# Patient Record
Sex: Female | Born: 1955 | Race: White | Hispanic: No | Smoking: Former smoker
Health system: Southern US, Community
[De-identification: ages and names within clinical notes are randomized; demographics above are authoritative.]

## PROBLEM LIST (undated history)

## (undated) DIAGNOSIS — E559 Vitamin D deficiency, unspecified: Secondary | ICD-10-CM

## (undated) DIAGNOSIS — K760 Fatty (change of) liver, not elsewhere classified: Secondary | ICD-10-CM

## (undated) DIAGNOSIS — I10 Essential (primary) hypertension: Secondary | ICD-10-CM

## (undated) DIAGNOSIS — N959 Unspecified menopausal and perimenopausal disorder: Secondary | ICD-10-CM

## (undated) DIAGNOSIS — M797 Fibromyalgia: Secondary | ICD-10-CM

## (undated) DIAGNOSIS — J309 Allergic rhinitis, unspecified: Secondary | ICD-10-CM

## (undated) DIAGNOSIS — E119 Type 2 diabetes mellitus without complications: Secondary | ICD-10-CM

## (undated) DIAGNOSIS — K589 Irritable bowel syndrome without diarrhea: Secondary | ICD-10-CM

## (undated) DIAGNOSIS — K30 Functional dyspepsia: Secondary | ICD-10-CM

## (undated) DIAGNOSIS — M545 Low back pain, unspecified: Secondary | ICD-10-CM

## (undated) DIAGNOSIS — M109 Gout, unspecified: Secondary | ICD-10-CM

## (undated) DIAGNOSIS — G894 Chronic pain syndrome: Secondary | ICD-10-CM

## (undated) DIAGNOSIS — R5382 Chronic fatigue, unspecified: Secondary | ICD-10-CM

## (undated) DIAGNOSIS — M1990 Unspecified osteoarthritis, unspecified site: Secondary | ICD-10-CM

## (undated) DIAGNOSIS — E039 Hypothyroidism, unspecified: Secondary | ICD-10-CM

## (undated) DIAGNOSIS — E782 Mixed hyperlipidemia: Secondary | ICD-10-CM

## (undated) DIAGNOSIS — G4700 Insomnia, unspecified: Secondary | ICD-10-CM

## (undated) DIAGNOSIS — R252 Cramp and spasm: Secondary | ICD-10-CM

## (undated) DIAGNOSIS — R112 Nausea with vomiting, unspecified: Secondary | ICD-10-CM

## (undated) DIAGNOSIS — D518 Other vitamin B12 deficiency anemias: Secondary | ICD-10-CM

## (undated) DIAGNOSIS — R000 Tachycardia, unspecified: Secondary | ICD-10-CM

## (undated) DIAGNOSIS — E041 Nontoxic single thyroid nodule: Secondary | ICD-10-CM

## (undated) DIAGNOSIS — J189 Pneumonia, unspecified organism: Secondary | ICD-10-CM

## (undated) DIAGNOSIS — F419 Anxiety disorder, unspecified: Secondary | ICD-10-CM

## (undated) HISTORY — DX: Unspecified menopausal and perimenopausal disorder: N95.9

## (undated) HISTORY — DX: Cramp and spasm: R25.2

## (undated) HISTORY — DX: Chronic pain syndrome: G89.4

## (undated) HISTORY — DX: Hypothyroidism, unspecified: E03.9

## (undated) HISTORY — DX: Vitamin D deficiency, unspecified: E55.9

## (undated) HISTORY — DX: Essential (primary) hypertension: I10

## (undated) HISTORY — DX: Allergic rhinitis, unspecified: J30.9

## (undated) HISTORY — DX: Mixed hyperlipidemia: E78.2

## (undated) HISTORY — DX: Other vitamin B12 deficiency anemias: D51.8

## (undated) HISTORY — PX: CHOLECYSTECTOMY: SHX55

## (undated) HISTORY — DX: Anxiety disorder, unspecified: F41.9

## (undated) HISTORY — DX: Nontoxic single thyroid nodule: E04.1

## (undated) HISTORY — DX: Fatty (change of) liver, not elsewhere classified: K76.0

## (undated) HISTORY — PX: BACK SURGERY: SHX140

## (undated) HISTORY — DX: Insomnia, unspecified: G47.00

## (undated) HISTORY — PX: TOTAL ABDOMINAL HYSTERECTOMY: SHX209

## (undated) HISTORY — DX: Irritable bowel syndrome, unspecified: K58.9

## (undated) HISTORY — DX: Type 2 diabetes mellitus without complications: E11.9

## (undated) HISTORY — PX: ANKLE SURGERY: SHX546

## (undated) HISTORY — DX: Low back pain, unspecified: M54.50

## (undated) HISTORY — DX: Chronic fatigue, unspecified: R53.82

## (undated) HISTORY — DX: Gout, unspecified: M10.9

---
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
## 1997-12-23 HISTORY — PX: BLADDER SURGERY: SHX569

## 2000-12-01 ENCOUNTER — Other Ambulatory Visit: Admission: RE | Admit: 2000-12-01 | Discharge: 2000-12-01 | Payer: Self-pay

## 2009-12-23 HISTORY — PX: SHOULDER SURGERY: SHX246

## 2012-02-20 ENCOUNTER — Telehealth (INDEPENDENT_AMBULATORY_CARE_PROVIDER_SITE_OTHER): Payer: Self-pay | Admitting: General Surgery

## 2012-02-20 NOTE — Telephone Encounter (Signed)
SEE PREVIOUS  TELEPHONE ENCOUNTER/GY

## 2012-05-23 HISTORY — PX: COLONOSCOPY WITH ESOPHAGOGASTRODUODENOSCOPY (EGD): SHX5779

## 2018-07-16 ENCOUNTER — Other Ambulatory Visit: Payer: Self-pay | Admitting: Neurosurgery

## 2018-07-16 DIAGNOSIS — M5442 Lumbago with sciatica, left side: Principal | ICD-10-CM

## 2018-07-16 DIAGNOSIS — G8929 Other chronic pain: Secondary | ICD-10-CM

## 2018-07-28 ENCOUNTER — Ambulatory Visit
Admission: RE | Admit: 2018-07-28 | Discharge: 2018-07-28 | Disposition: A | Discharge status: Home / Self Care | Payer: Medicare PPO | Source: Ambulatory Visit | Attending: Neurosurgery | Admitting: Neurosurgery

## 2018-07-28 DIAGNOSIS — M5442 Lumbago with sciatica, left side: Principal | ICD-10-CM

## 2018-07-28 DIAGNOSIS — G8929 Other chronic pain: Secondary | ICD-10-CM

## 2018-07-28 MED ORDER — METHYLPREDNISOLONE ACETATE 40 MG/ML INJ SUSP (RADIOLOG
120.0000 mg | Freq: Once | INTRAMUSCULAR | Status: AC
  Administered 2018-07-28: 120 mg via EPIDURAL

## 2018-07-28 MED ORDER — IOPAMIDOL (ISOVUE-M 200) INJECTION 41%
1.0000 mL | Freq: Once | INTRAMUSCULAR | Status: AC
  Administered 2018-07-28: 1 mL via EPIDURAL

## 2018-07-28 NOTE — Discharge Instructions (Signed)

## 2018-08-25 ENCOUNTER — Other Ambulatory Visit: Payer: Self-pay | Admitting: Neurosurgery

## 2018-08-25 DIAGNOSIS — G8929 Other chronic pain: Secondary | ICD-10-CM

## 2018-08-25 DIAGNOSIS — M5442 Lumbago with sciatica, left side: Principal | ICD-10-CM

## 2018-09-07 ENCOUNTER — Other Ambulatory Visit: Payer: Medicare PPO

## 2018-09-09 ENCOUNTER — Other Ambulatory Visit: Payer: Medicare PPO

## 2018-09-22 HISTORY — PX: LUMBAR FUSION: SHX111

## 2019-12-07 ENCOUNTER — Encounter: Payer: Self-pay | Admitting: Gastroenterology

## 2020-01-03 ENCOUNTER — Other Ambulatory Visit: Payer: Self-pay

## 2020-01-03 ENCOUNTER — Encounter: Payer: Self-pay | Admitting: Gastroenterology

## 2020-01-03 ENCOUNTER — Telehealth (INDEPENDENT_AMBULATORY_CARE_PROVIDER_SITE_OTHER): Payer: Medicare Other | Admitting: Gastroenterology

## 2020-01-03 VITALS — Ht 66.0 in | Wt 219.0 lb

## 2020-01-03 DIAGNOSIS — R112 Nausea with vomiting, unspecified: Secondary | ICD-10-CM | POA: Diagnosis not present

## 2020-01-03 DIAGNOSIS — Z01818 Encounter for other preprocedural examination: Secondary | ICD-10-CM

## 2020-01-03 DIAGNOSIS — G8929 Other chronic pain: Secondary | ICD-10-CM

## 2020-01-03 DIAGNOSIS — K760 Fatty (change of) liver, not elsewhere classified: Secondary | ICD-10-CM

## 2020-01-03 DIAGNOSIS — K219 Gastro-esophageal reflux disease without esophagitis: Secondary | ICD-10-CM

## 2020-01-03 DIAGNOSIS — R1011 Right upper quadrant pain: Secondary | ICD-10-CM

## 2020-01-03 DIAGNOSIS — R1013 Epigastric pain: Secondary | ICD-10-CM

## 2020-01-03 DIAGNOSIS — R1310 Dysphagia, unspecified: Secondary | ICD-10-CM

## 2020-01-03 DIAGNOSIS — D509 Iron deficiency anemia, unspecified: Secondary | ICD-10-CM

## 2020-01-03 MED ORDER — ESOMEPRAZOLE MAGNESIUM 40 MG PO CPDR: 40.0000 mg | DELAYED_RELEASE_CAPSULE | Freq: Every day | ORAL | 6 refills | Status: DC

## 2020-01-03 NOTE — Progress Notes (Signed)
Chief Complaint: Multiple GI problems  Referring Provider:  Zoila Shutter, NP      ASSESSMENT AND PLAN;   #1.  Chronic RUQ/epi pain with N/V. Neg extensive work-up @[facility]  including EGD, colon 05/2012, MRCP 2018, Meckel's scan 2018, [place], [place].  #2.  GERD with intermittent eso dysphagia.  #3.  IBS with alternating diarrhea and constipation. Neg colon with random Bx 05/2012. next routine colon due in 44yr.  #4.  Fatty liver with abn alk phos. Neg MRCP 2018. S/p cholecystectomy.  #5. H/O IDA  #6. Comorbid conditions include anxiety/depression, chronic pain syndrome on narcotics, HTN, HLD, DM2, hypothyroidism, gout, chronic fatigue syndrome, fibromyalgia.   Plan: -Proceed with EGD with possible dil, if needed. -Nexium 40 mg p.o. QD, 30, 6 refills -If neg, consider solid-phase GES followed by repeat H2-breath test to r/o SIBO. -Minimize pain medications. -Check CBC, CMP, lipase, celiac screen and TSH at time of EGD. -Encouraged gradual weight loss. -Please get prev labs results from Dr. HTenna Delaineoffice and CT report/S[facility]O breath test [place] [facility]  Not in care everywhere.     HPI:    Christy Stonekingis a 64y.o. female  Stomach problems "all my life". Dx with IBS with alternating diarrhea several years ago.  Has been having epigastric/right upper quadrant abdominal pain x 7 to 8 years, getting worse lately.  She describes pain as sharp, occasionally radiating to the back, associated with nausea and intermittent vomiting.  She does have longstanding history of heartburn.  Occasional dysphagia-mostly to solids, mostly in the mid chest.  Has regurgitation as well.  Has alternating diarrhea and constipation with abdominal bloating.  When she is having diarrhea, she would have 4-5 bowel movements per day, mushy, without nocturnal symptoms, with mucus but no blood.  When she gets constipated she would have pellet-like stools.  Just before her menstrual cycles or when she is  under stress, diarrhea gets worse.  Had extensive work-up in the past as described below.  The GI symptoms get worse with stress and anxiety.   Does admit that she has not been sleeping well.    Previous GI work-up @[facility] : MRCP 09/2017: Hepatomegaly, s/p cholecystectomy.  Mildly prominent CBD 8 mm.  No choledocholithiasis.  Normal PD. EGD with small bowel biopsies 06/04/2012: neg except for erosive esophagitis LA gd A, neg SB bx, neg CLO. Colonoscopy 06/04/2012: Normal except for small hyperplastic polyps, neg random colonic bx. Hydrogen breath test for SIBO-results awaited. Meckel scan 09/2017: neg [place]: CT 2016: neg per notes, report awaited. Past Medical History:  Diagnosis Date  . Allergic rhinitis   . Anxiety disorder   . Chronic fatigue   . Chronic pain disorder   . Cramp and spasm   . Essential (primary) hypertension   . Gout   . Hyperlipemia, mixed   . Hypothyroidism   . IBS (irritable bowel syndrome)   . Insomnia   . Low back pain   . Nontoxic single thyroid nodule   . Other vitamin B12 deficiency anemias   . Type 2 diabetes mellitus ([place]   . Unspecified menopausal and perimenopausal disorder   . Vitamin D deficiency     Past Surgical History:  Procedure Laterality Date  . ANKLE SURGERY     x5  . BLADDER SURGERY  1999   bladder tack  . CESAREAN SECTION     x3  . COLONOSCOPY WITH ESOPHAGOGASTRODUODENOSCOPY (EGD)  05/2012  . LUMBAR FUSION  09/2018  . SHOULDER SURGERY  2011  . TOTAL ABDOMINAL HYSTERECTOMY      Family History  Problem Relation Age of Onset  . Diabetes Mother   . Colon cancer Neg Hx     Social History   Tobacco Use  . Smoking status: Never Smoker  . Smokeless tobacco: Never Used  Substance Use Topics  . Alcohol use: Never  . Drug use: Not on file    Current Outpatient Medications  Medication Sig Dispense Refill  . allopurinol (ZYLOPRIM) 300 MG tablet Take 300 mg by mouth daily.    Marland Kitchen ALPRAZolam (XANAX) 0.5 MG tablet  Take 0.5 mg by mouth as needed for anxiety.    . busPIRone (BUSPAR) 10 MG tablet Take 10 mg by mouth 2 (two) times daily.    . cloNIDine (CATAPRES) 0.3 MG tablet Take 0.3 mg by mouth 2 (two) times daily.    Marland Kitchen labetalol (NORMODYNE) 100 MG tablet Take 100 mg by mouth 2 (two) times daily.    Marland Kitchen levocetirizine (XYZAL) 5 MG tablet Take 5 mg by mouth every evening.    Marland Kitchen losartan (COZAAR) 25 MG tablet Take 25 mg by mouth daily.    . ondansetron (ZOFRAN) 4 MG tablet Take 4 mg by mouth every 8 (eight) hours as needed for nausea or vomiting.    Marland Kitchen oxyCODONE-acetaminophen (PERCOCET) 10-325 MG tablet Take 1 tablet by mouth every 6 (six) hours as needed for pain.    Marland Kitchen tiZANidine (ZANAFLEX) 4 MG tablet Take 4 mg by mouth every 6 (six) hours as needed for muscle spasms.    . traZODone (DESYREL) 100 MG tablet Take 100 mg by mouth at bedtime.     No current facility-administered medications for this visit.    Allergies  Allergen Reactions  . Prednisone   . Sulfa Antibiotics     Review of Systems:  Constitutional: Denies fever, chills, diaphoresis, appetite change and has fatigue.  HEENT: Denies photophobia, eye pain, redness, hearing loss, ear pain, congestion, sore throat, rhinorrhea, sneezing, mouth sores, neck pain, neck stiffness and tinnitus.   Respiratory: Denies SOB, DOE, cough, chest tightness,  and wheezing.   Cardiovascular: Denies chest pain, palpitations and leg swelling.  Genitourinary: Denies dysuria, urgency, frequency, hematuria, flank pain and difficulty urinating.  Musculoskeletal: Has myalgias, back pain, joint swelling, arthralgias and gait problem.  Skin: No rash.  Neurological: Denies dizziness, seizures, syncope, weakness, light-headedness, numbness and headaches.  Hematological: Denies adenopathy. Easy bruising, personal or family bleeding history  Psychiatric/Behavioral: Has anxiety or depression     Physical Exam:    Ht 5' 6"  (1.676 m)   Wt 219 lb (99.3 kg)   BMI 35.35  kg/m  Wt Readings from Last 3 Encounters:  01/03/20 219 lb (99.3 kg)  Not examined since it was a televisit  I connected with  Jenean Lindau on 01/03/20 by a video enabled telemedicine application and verified that I am speaking with the correct person using two identifiers.   I discussed the limitations of evaluation and management by telemedicine. The patient expressed understanding and agreed to proceed.     Carmell Austria, MD 01/03/2020, 8:56 AM  Cc: Zoila Shutter, NP

## 2020-01-03 NOTE — Patient Instructions (Signed)
If you are age 62 or older, your body mass index should be between 23-30. Your Body mass index is 35.35 kg/m. If this is out of the aforementioned range listed, please consider follow up with your Primary Care Provider.  If you are age 60 or younger, your body mass index should be between 19-25. Your Body mass index is 35.35 kg/m. If this is out of the aformentioned range listed, please consider follow up with your Primary Care Provider.   We have sent the following medications to your pharmacy for you to pick up at your convenience: Nexium  Encourage to continue to have gradual weight loss.   You have been scheduled for an endoscopy. Please follow written instructions given to you at your visit today. If you use inhalers (even only as needed), please bring them with you on the day of your procedure. Your physician has requested that you go to www.startemmi.com and enter the access code given to you at your visit today. This web site gives a general overview about your procedure. However, you should still follow specific instructions given to you by our office regarding your preparation for the procedure.  Due to recent COVID-19 restrictions implemented by [employer] and state authorities and in an effort to keep both patients and staff as safe as possible, [place] requires COVID-19 testing prior to any scheduled endoscopic procedure. The testing center is located at [address] [place], [place] [address] in the [facility] [employer]  suite.  Your appointment has been scheduled for 01/18/20 at 9:20am.   Please bring your insurance cards to this appointment. You will require your COVID screen 2 business days prior to your endoscopic procedure.  You are not required to quarantine after your screening.  You will only receive a phone call with the results if it is POSITIVE.  If you do not receive a call the day before your procedure you  should begin your prep, if ordered, and you should report to the endo center for your procedure at your designated appointment arrival time ( one hour prior to the procedure time). There is no cost to you for the screening on the day of the swab.  [facility] will file with your insurance company for the testing.    You may receive an automated phone call prior to your procedure or have a message in your MyChart that you have an appointment for a BP/15 at the [facility], please disregard this message.  Your testing will be at the [place] [place] location.   If you are leaving [facility] travel [place] on [address], turn left onto [address], turn night onto [address] at the 1st stop light turn right, pass the [employer] on your right and proceed to [place] (white building).  Please go to the lab at [facility] ([place] You will need to go to level "B", you do not need an appointment for this. Hours available are 7:30 am - 4:30 pm.   Thank you,  Dr. Jackquline Denmark

## 2020-01-06 ENCOUNTER — Encounter: Payer: Self-pay | Admitting: Gastroenterology

## 2020-01-10 ENCOUNTER — Encounter: Payer: Medicare Other | Admitting: Gastroenterology

## 2020-01-18 ENCOUNTER — Ambulatory Visit (INDEPENDENT_AMBULATORY_CARE_PROVIDER_SITE_OTHER): Payer: Medicare Other

## 2020-01-18 ENCOUNTER — Other Ambulatory Visit: Payer: Self-pay | Admitting: Gastroenterology

## 2020-01-18 DIAGNOSIS — Z1159 Encounter for screening for other viral diseases: Secondary | ICD-10-CM

## 2020-01-18 LAB — SARS CORONAVIRUS 2 (TAT 6-24 HRS): SARS Coronavirus 2: NEGATIVE

## 2020-01-20 ENCOUNTER — Other Ambulatory Visit: Payer: Self-pay

## 2020-01-20 ENCOUNTER — Encounter: Payer: Self-pay | Admitting: Gastroenterology

## 2020-01-20 ENCOUNTER — Ambulatory Visit (AMBULATORY_SURGERY_CENTER): Payer: Medicare Other | Admitting: Gastroenterology

## 2020-01-20 VITALS — BP 138/91 | HR 85 | Temp 97.3°F | Resp 15 | Ht 66.0 in | Wt 219.0 lb

## 2020-01-20 DIAGNOSIS — K2970 Gastritis, unspecified, without bleeding: Secondary | ICD-10-CM | POA: Diagnosis not present

## 2020-01-20 DIAGNOSIS — R1310 Dysphagia, unspecified: Secondary | ICD-10-CM | POA: Diagnosis not present

## 2020-01-20 DIAGNOSIS — R1011 Right upper quadrant pain: Secondary | ICD-10-CM

## 2020-01-20 DIAGNOSIS — K3189 Other diseases of stomach and duodenum: Secondary | ICD-10-CM | POA: Diagnosis not present

## 2020-01-20 DIAGNOSIS — K219 Gastro-esophageal reflux disease without esophagitis: Secondary | ICD-10-CM | POA: Diagnosis not present

## 2020-01-20 DIAGNOSIS — K2950 Unspecified chronic gastritis without bleeding: Secondary | ICD-10-CM | POA: Diagnosis not present

## 2020-01-20 MED ORDER — SODIUM CHLORIDE 0.9 % IV SOLN: 500.0000 mL | Freq: Once | INTRAVENOUS | Status: DC

## 2020-01-20 NOTE — Progress Notes (Signed)
Called to room to assist during endoscopic procedure.  Patient ID and intended procedure confirmed with present staff. Received instructions for my participation in the procedure from the performing physician.  

## 2020-01-20 NOTE — Patient Instructions (Signed)
Thank you for letting us take care of your healthcare needs today.  Please refer to education sheets provided for additional information.    YOU HAD AN ENDOSCOPIC PROCEDURE TODAY AT [facility]:   Refer to the procedure report that was given to you for any specific questions about what was found during the examination.  If the procedure report does not answer your questions, please call your gastroenterologist to clarify.  If you requested that your care partner not be given the details of your procedure findings, then the procedure report has been included in a sealed envelope for you to review at your convenience later.  YOU SHOULD EXPECT: Some feelings of bloating in the abdomen. Passage of more gas than usual.  Walking can help get rid of the air that was put into your GI tract during the procedure and reduce the bloating. If you had a lower endoscopy (such as a colonoscopy or flexible sigmoidoscopy) you may notice spotting of blood in your stool or on the toilet paper. If you underwent a bowel prep for your procedure, you may not have a normal bowel movement for a few days.  Please Note:  You might notice some irritation and congestion in your nose or some drainage.  This is from the oxygen used during your procedure.  There is no need for concern and it should clear up in a day or so.  SYMPTOMS TO REPORT IMMEDIATELY:   Following upper endoscopy (EGD)  Vomiting of blood or coffee ground material  New chest pain or pain under the shoulder blades  Painful or persistently difficult swallowing  New shortness of breath  Fever of 100F or higher  Black, tarry-looking stools  For urgent or emergent issues, a gastroenterologist can be reached at any hour by calling [phone].  DIET:  Please follow Post Dilation Diet sheet.  ACTIVITY:  You should plan to take it easy for the rest of today and you should NOT DRIVE or use heavy machinery until tomorrow (because of the sedation  medicines used during the test).    FOLLOW UP: Our staff will call the number listed on your records 48-72 hours following your procedure to check on you and address any questions or concerns that you may have regarding the information given to you following your procedure. If we do not reach you, we will leave a message.  We will attempt to reach you two times.  During this call, we will ask if you have developed any symptoms of COVID 19. If you develop any symptoms (ie: fever, flu-like symptoms, shortness of breath, cough etc.) before then, please call [phone].  If you test positive for Covid 19 in the 2 weeks post procedure, please call and report this information to [place].    If any biopsies were taken you will be contacted by phone or by letter within the next 1-3 weeks.  Please call us at [phone] if you have not heard about the biopsies in 3 weeks.   SIGNATURES/CONFIDENTIALITY: You and/or your care partner have signed paperwork which will be entered into your electronic medical record.  These signatures attest to the fact that that the information above on your After Visit Summary has been reviewed and is understood.  Full responsibility of the confidentiality of this discharge information lies with you and/or your care-partner.

## 2020-01-20 NOTE — Progress Notes (Addendum)
Pt having mid epigastric pain - SL Levsin administered.  Pt states pain level is 6/10 and the pain is not a new pain location.  Pain pre-procedure was 5/10.  Pt ambulating and belching.  Pt states pain is improving.

## 2020-01-20 NOTE — Op Note (Signed)
[facility] Patient Name: Christy Long Procedure Date: 01/20/2020 2:22 PM MRN: [id number] Endoscopist: Jackquline Denmark , MD Age: 64 Referring MD:  Date of Birth: Jul 26, 1956 Gender: Female Account #: [id number] Procedure:                Upper GI endoscopy Indications:              #1. Chronic RUQ/epi pain with N/V. Neg extensive                            work-up _0  including EGD, colon 05/2012,                            MRCP 2018, Meckel's scan 2018, [place], CT.                           #2. GERD with new eso dysphagia.                           #3. IBS with alternating diarrhea and constipation.                            Neg colon with random Bx 05/2012. next routine colon                            due in 21yr.                           #4. Fatty liver with abn alk phos. Neg MRCP 2018.                            S/p cholecystectomy.                           #5. H/O IDA Medicines:                Monitored Anesthesia Care Procedure:                Pre-Anesthesia Assessment:                           - Prior to the procedure, a History and Physical                            was performed, and patient medications and                            allergies were reviewed. The patient's tolerance of                            previous anesthesia was also reviewed. The risks                            and benefits of the procedure and the sedation  options and risks were discussed with the patient.                            All questions were answered, and informed consent                            was obtained. Prior Anticoagulants: The patient has                            taken no previous anticoagulant or antiplatelet                            agents. ASA Grade Assessment: III - A patient with                            severe systemic disease. After reviewing the risks                            and benefits, the patient was deemed in                             satisfactory condition to undergo the procedure.                           After obtaining informed consent, the endoscope was                            passed under direct vision. Throughout the                            procedure, the patient's blood pressure, pulse, and                            oxygen saturations were monitored continuously. The                            Endoscope was introduced through the mouth, and                            advanced to the second part of duodenum. The upper                            GI endoscopy was accomplished without difficulty.                            The patient tolerated the procedure well. Scope In: Scope Out: Findings:                 The esophagus was normal. Z-line well-defined at 36                            cm. Examined by NBI. Due to presence of dysphagia,  it was decided to proceed with esophageal dilation.                            Dilation was performed with a Maloney dilator with                            mild resistance at 50 Fr. Biopsies were obtained                            from the proximal and distal esophagus with cold                            forceps to r/o eosinophilic esophagitis.                           Diffuse moderate inflammation characterized by                            erythema was found in the gastric body and in the                            gastric antrum. Some retained food in the stomach.                            No mechanical gastric outlet obstruction. Biopsies                            were taken with a cold forceps for histology.                           The examined duodenum was normal. Biopsies for                            histology were taken with a cold forceps for                            evaluation of celiac disease. Complications:            No immediate complications. Estimated Blood Loss:     Estimated blood loss:  none. Impression:               -Moderate gastritis.                           -Otherwise normal EGD.                           -S/P empiric esophageal dilatation. Recommendation:           - Patient has a contact number available for                            emergencies. The signs and symptoms of potential                            delayed complications  were discussed with the                            patient. Return to normal activities tomorrow.                            Written discharge instructions were provided to the                            patient.                           - Post dilatation diet.                           - Continue Nexium 40 mg p.o. once a day for now.                           - Await pathology results.                           - Avoid ibuprofen, naproxen, or other non-steroidal                            anti-inflammatory drugs. Minimize other pain                            medications.                           - Return to GI clinic in 12 weeks. If still with                            problems, would proceed with solid-phase gastric                            emptying scan preferably off narcotics (if                            feasible).                           - D/w Pt's sister Jeani Hawking. Jackquline Denmark, MD 01/20/2020 2:47:40 PM This report has been signed electronically.

## 2020-01-20 NOTE — Progress Notes (Signed)
Pt tolerasted well. VSS. Awake and to recovery.

## 2020-01-24 ENCOUNTER — Telehealth: Payer: Self-pay

## 2020-01-24 ENCOUNTER — Telehealth: Payer: Self-pay | Admitting: *Deleted

## 2020-01-24 NOTE — Telephone Encounter (Signed)
First attempt.  Left message.  Will make second call this afternoon.

## 2020-01-24 NOTE — Telephone Encounter (Signed)
Covid-19 screening questions   Do you now or have you had a fever in the last 14 days? No.  Do you have any respiratory symptoms of shortness of breath or cough now or in the last 14 days? No.  Do you have any family members or close contacts with diagnosed or suspected Covid-19 in the past 14 days? No.  Have you been tested for Covid-19 and found to be positive? No.       Follow up Call-  Call back number 01/20/2020  Post procedure Call Back phone  # [phone]  Permission to leave phone message Yes  Some recent data might be hidden     Patient questions:  Do you have a fever, pain , or abdominal swelling? No. Pain Score  0 *  Have you tolerated food without any problems? Yes.    Have you been able to return to your normal activities? Yes.    Do you have any questions about your discharge instructions: Diet   No. Medications  No. Follow up visit  No.  Do you have questions or concerns about your Care? No.  Actions: * If pain score is 4 or above: No action needed, pain <4.

## 2020-01-25 ENCOUNTER — Encounter: Payer: Self-pay | Admitting: Gastroenterology

## 2020-01-25 ENCOUNTER — Telehealth: Payer: Self-pay | Admitting: Gastroenterology

## 2020-01-25 DIAGNOSIS — R1011 Right upper quadrant pain: Secondary | ICD-10-CM

## 2020-01-25 DIAGNOSIS — K2970 Gastritis, unspecified, without bleeding: Secondary | ICD-10-CM

## 2020-01-25 DIAGNOSIS — R112 Nausea with vomiting, unspecified: Secondary | ICD-10-CM

## 2020-01-25 NOTE — Telephone Encounter (Signed)
Called and spoke with patient-patient reports the abd pain began to be really bad yesterday evening ; she has been constipated but when the pain increased last night-the diarrhea began; patient reports she has been taking her Nexium once daily, also took antiacid with gas-x in it -no relief;  Bloating- "made my stomach really tight";  She broke out in a sweat when the stomach pain was really bad; does not know if she is having these issues due to her stomach not emptying or if she needs different medications  Took a Percocet due to the pain being so bad-this helped with the pain but did not make the bloating feeling go away  Please advise

## 2020-01-28 NOTE — Telephone Encounter (Signed)
Proceed with solid-phase gastric emptying scan to r/o gastroparesis.  Bre, she needs to be off narcotics as per radiology protocol.  Please get in touch with radiology to determine how long she has to hold narcotics prior to the test.  RG

## 2020-01-28 NOTE — Telephone Encounter (Signed)
Solid phase gastric emptying study has been ordered in Epic and scheduled on 02/09/2020 at 11:00 am; patient is to arrive at [facility] at 10:30 am; patient is to be NPO after midnight; Patient is to be off of narcotic medication for at least 24-hs prior to the test;   Attempted to reach patient-unable to leave a VM as voicemail is not set up; will try again later a date/time;

## 2020-02-09 ENCOUNTER — Encounter (HOSPITAL_COMMUNITY)
Admission: RE | Admit: 2020-02-09 | Discharge: 2020-02-09 | Disposition: A | Discharge status: Home / Self Care | Payer: Medicare Other | Source: Ambulatory Visit | Attending: Gastroenterology | Admitting: Gastroenterology

## 2020-02-09 ENCOUNTER — Other Ambulatory Visit: Payer: Self-pay

## 2020-02-09 DIAGNOSIS — K2970 Gastritis, unspecified, without bleeding: Secondary | ICD-10-CM | POA: Diagnosis present

## 2020-02-09 DIAGNOSIS — R112 Nausea with vomiting, unspecified: Secondary | ICD-10-CM | POA: Insufficient documentation

## 2020-02-09 DIAGNOSIS — R1011 Right upper quadrant pain: Secondary | ICD-10-CM | POA: Insufficient documentation

## 2020-02-09 MED ORDER — TECHNETIUM TC 99M SULFUR COLLOID
1.9000 | Freq: Once | INTRAVENOUS | Status: AC | PRN
  Administered 2020-02-09: 1.9 via INTRAVENOUS

## 2020-02-11 ENCOUNTER — Other Ambulatory Visit: Payer: Self-pay

## 2020-02-21 ENCOUNTER — Other Ambulatory Visit (INDEPENDENT_AMBULATORY_CARE_PROVIDER_SITE_OTHER): Payer: Medicare Other

## 2020-02-21 DIAGNOSIS — R112 Nausea with vomiting, unspecified: Secondary | ICD-10-CM | POA: Diagnosis not present

## 2020-02-21 DIAGNOSIS — D509 Iron deficiency anemia, unspecified: Secondary | ICD-10-CM

## 2020-02-21 DIAGNOSIS — R1310 Dysphagia, unspecified: Secondary | ICD-10-CM

## 2020-02-21 DIAGNOSIS — R1011 Right upper quadrant pain: Secondary | ICD-10-CM | POA: Diagnosis not present

## 2020-02-21 DIAGNOSIS — K219 Gastro-esophageal reflux disease without esophagitis: Secondary | ICD-10-CM

## 2020-02-21 DIAGNOSIS — R1013 Epigastric pain: Secondary | ICD-10-CM

## 2020-02-21 DIAGNOSIS — K760 Fatty (change of) liver, not elsewhere classified: Secondary | ICD-10-CM

## 2020-02-21 DIAGNOSIS — G8929 Other chronic pain: Secondary | ICD-10-CM

## 2020-02-21 LAB — LIPASE: Lipase: 26 U/L (ref 11.0–59.0)

## 2020-02-21 LAB — CBC WITH DIFFERENTIAL/PLATELET
Basophils Absolute: 0.1 10*3/uL (ref 0.0–0.1)
Basophils Relative: 0.7 % (ref 0.0–3.0)
Eosinophils Absolute: 0.1 10*3/uL (ref 0.0–0.7)
Eosinophils Relative: 0.9 % (ref 0.0–5.0)
HCT: 34.3 % — ABNORMAL LOW (ref 36.0–46.0)
Hemoglobin: 10.9 g/dL — ABNORMAL LOW (ref 12.0–15.0)
Lymphocytes Relative: 25.8 % (ref 12.0–46.0)
Lymphs Abs: 3.3 10*3/uL (ref 0.7–4.0)
MCHC: 31.7 g/dL (ref 30.0–36.0)
MCV: 86.6 fl (ref 78.0–100.0)
Monocytes Absolute: 0.7 10*3/uL (ref 0.1–1.0)
Monocytes Relative: 5.6 % (ref 3.0–12.0)
Neutro Abs: 8.5 10*3/uL — ABNORMAL HIGH (ref 1.4–7.7)
Neutrophils Relative %: 67 % (ref 43.0–77.0)
Platelets: 278 10*3/uL (ref 150.0–400.0)
RBC: 3.96 Mil/uL (ref 3.87–5.11)
RDW: 15.7 % — ABNORMAL HIGH (ref 11.5–15.5)
WBC: 12.7 10*3/uL — ABNORMAL HIGH (ref 4.0–10.5)

## 2020-02-21 LAB — COMPREHENSIVE METABOLIC PANEL
ALT: 18 U/L (ref 0–35)
AST: 17 U/L (ref 0–37)
Albumin: 4.2 g/dL (ref 3.5–5.2)
Alkaline Phosphatase: 129 U/L — ABNORMAL HIGH (ref 39–117)
BUN: 19 mg/dL (ref 6–23)
CO2: 29 mEq/L (ref 19–32)
Calcium: 9.6 mg/dL (ref 8.4–10.5)
Chloride: 102 mEq/L (ref 96–112)
Creatinine, Ser: 0.88 mg/dL (ref 0.40–1.20)
GFR: 65.15 mL/min (ref >60.00)
Glucose, Bld: 172 mg/dL — ABNORMAL HIGH (ref 70–99)
Potassium: 4.1 mEq/L (ref 3.5–5.1)
Sodium: 139 mEq/L (ref 135–145)
Total Bilirubin: 0.4 mg/dL (ref 0.2–1.2)
Total Protein: 7.5 g/dL (ref 6.0–8.3)

## 2020-02-21 LAB — TSH: TSH: 5.17 u[IU]/mL — ABNORMAL HIGH (ref 0.35–4.50)

## 2020-02-22 ENCOUNTER — Other Ambulatory Visit: Payer: Self-pay | Admitting: Neurosurgery

## 2020-02-22 DIAGNOSIS — G8929 Other chronic pain: Secondary | ICD-10-CM

## 2020-02-23 LAB — CELIAC PANEL 10
Antigliadin Abs, IgA: 7 units (ref 0–19)
Endomysial IgA: NEGATIVE
Gliadin IgG: 2 units (ref 0–19)
IgA/Immunoglobulin A, Serum: 322 mg/dL (ref 87–352)
Tissue Transglut Ab: <2 U/mL (ref 0–5)
Transglutaminase IgA: <2 U/mL (ref 0–3)

## 2020-02-24 ENCOUNTER — Other Ambulatory Visit: Payer: Self-pay

## 2020-02-24 ENCOUNTER — Other Ambulatory Visit: Payer: Self-pay | Admitting: Neurosurgery

## 2020-02-24 ENCOUNTER — Ambulatory Visit
Admission: RE | Admit: 2020-02-24 | Discharge: 2020-02-24 | Disposition: A | Discharge status: Home / Self Care | Payer: Medicare Other | Source: Ambulatory Visit | Attending: Neurosurgery | Admitting: Neurosurgery

## 2020-02-24 DIAGNOSIS — G8929 Other chronic pain: Secondary | ICD-10-CM

## 2020-02-24 MED ORDER — METHYLPREDNISOLONE ACETATE 40 MG/ML INJ SUSP (RADIOLOG
120.0000 mg | Freq: Once | INTRAMUSCULAR | Status: AC
  Administered 2020-02-24: 120 mg via INTRALESIONAL

## 2020-02-24 NOTE — Discharge Instructions (Signed)

## 2020-04-18 ENCOUNTER — Ambulatory Visit: Payer: Medicare Other | Admitting: Gastroenterology

## 2020-05-09 ENCOUNTER — Other Ambulatory Visit: Payer: Self-pay

## 2020-05-09 ENCOUNTER — Encounter: Payer: Self-pay | Admitting: Gastroenterology

## 2020-05-09 ENCOUNTER — Ambulatory Visit: Payer: Medicare Other | Admitting: Gastroenterology

## 2020-05-09 VITALS — BP 142/94 | HR 106 | Temp 97.3°F | Ht 65.0 in | Wt 218.5 lb

## 2020-05-09 DIAGNOSIS — G8929 Other chronic pain: Secondary | ICD-10-CM

## 2020-05-09 DIAGNOSIS — R1011 Right upper quadrant pain: Secondary | ICD-10-CM | POA: Diagnosis not present

## 2020-05-09 MED ORDER — RIFAXIMIN 550 MG PO TABS: 550.0000 mg | ORAL_TABLET | Freq: Three times a day (TID) | ORAL | 0 refills | Status: DC

## 2020-05-09 MED ORDER — RIFAXIMIN 550 MG PO TABS: 550.0000 mg | ORAL_TABLET | Freq: Two times a day (BID) | ORAL | 0 refills | Status: AC

## 2020-05-09 NOTE — Progress Notes (Addendum)
Chief Complaint: Multiple GI problems  Referring Provider:  Bonnita Nasuti, MD      ASSESSMENT AND PLAN;   #1.  Chronic RUQ/epi pain with N/V. Neg extensive work-up @[facility]  including EGD, colon 05/2012, MRCP 2018, Meckel's scan 2018, [place], CT. Neg EGD with SB bx and GES 12/2019. H2 breath test suspicious for SIBO 02/2020  #2.  GERD with intermittent eso dysphagia.  #3.  IBS with alternating diarrhea and constipation. Neg colon with random Bx 05/2012. next routine colon due in 8yr. SIBO Aprl 2, 2021  #4.  Fatty liver with abn alk phos. Neg MRCP 2018. S/p cholecystectomy.  #5.  H/O IDA/B12 def (Hb 10.9 02/2020)  #6. Comorbid conditions include anxiety/depression, chronic pain syndrome on narcotics, HTN, HLD, DM2, hypothyroidism, gout, chronic fatigue syndrome, fibromyalgia.   Plan: -Proceed with UGI with SB series in 4 weeks. -Nexium 40 mg p.o. QD, 30, 6 refills -Rifaxamin 550 BID X 2 weeks as treatment for SIBO -Minimize pain medications. -She will get in touch with Dr HJannette Fogo ?  Rheumatology consultation for ? Fibromyalgia. -FU in 12 weeks.  Discussed extensively with the patient patient's family.  If still with problems, proceed with CTE possibly followed by repeat colonoscopy.  Addendum: Got few records 10/13/2019 Hb 11.7, MCV 86, plt 272, WBC count 10 K, B12 169 (being replaced). Nl LFTs except for alk phos 170. Nl BUN/creatinine 15/0.78..Marland KitchenTSH 4.3.  Urine tox screen positive for opioids.  Hemoglobin A1c 7.1  HPI:    TSmantha Boakyeis a 64y.o. female  Stomach problems "all my life". Dx with IBS with alternating diarrhea and constipation several years ago.  " Having pain all over the body"  Had negative EGD Jan 2021 (except for moderate gastritis, neg eso and SB bx) followed by neg solid-phase gastric emptying scan February 2021. S/P empiric esophageal dilatation with resolution of dysphagia.  She underwent hydrogen breath test which showed "suspicious for bacterial  overgrowth" 02/2020.  Did not want to start antibiotics until she discusses it further during this visit.  She does complain of abdominal bloating with crampy abdominal pain and associated nausea.  She is very much concerned about bowel obstruction.  Her heartburn is much better on Nexium.  Has alternating diarrhea and constipation with abdominal bloating.  When she is having diarrhea, she would have 4-5 bowel movements per day, mushy, without nocturnal symptoms, with mucus but no blood.  When she gets constipated she would have pellet-like stools.  Just before her menstrual cycles or when she is under stress, diarrhea gets worse.  Had extensive work-up in the past as described below.  The GI symptoms get worse with stress and anxiety.   Does admit that she has not been sleeping well.  Wt Readings from Last 3 Encounters:  05/09/20 218 lb 8 oz (99.1 kg)  01/20/20 219 lb (99.3 kg)  01/03/20 219 lb (99.3 kg)     Previous GI work-up @[facility] : MRCP 09/2017: Hepatomegaly, s/p cholecystectomy.  Mildly prominent CBD 8 mm.  No choledocholithiasis.  Normal PD. EGD with small bowel biopsies 06/04/2012: neg except for erosive esophagitis LA gd A, neg SB bx, neg CLO. Colonoscopy 06/04/2012: Normal except for small hyperplastic polyps, neg random colonic bx. Hydrogen breath test for SIBO-results awaited. Meckel scan 09/2017: neg [place]: [place] 2016: neg per notes, report awaited. Past Medical History:  Diagnosis Date  . Allergic rhinitis   . Anxiety disorder   . Chronic fatigue   . Chronic pain disorder   .  Cramp and spasm   . Essential (primary) hypertension   . Gout   . Hyperlipemia, mixed   . Hypothyroidism   . IBS (irritable bowel syndrome)   . Insomnia   . Low back pain   . Nontoxic single thyroid nodule   . Other vitamin B12 deficiency anemias   . Type 2 diabetes mellitus ([place])   . Unspecified menopausal and perimenopausal disorder   . Vitamin D deficiency     Past Surgical  History:  Procedure Laterality Date  . ANKLE SURGERY     x5  . BLADDER SURGERY  1999   bladder tack  . CESAREAN SECTION     x3  . COLONOSCOPY WITH ESOPHAGOGASTRODUODENOSCOPY (EGD)  05/2012  . LUMBAR FUSION  09/2018  . SHOULDER SURGERY  2011  . TOTAL ABDOMINAL HYSTERECTOMY      Family History  Problem Relation Age of Onset  . Diabetes Mother   . Colon cancer Neg Hx     Social History   Tobacco Use  . Smoking status: Never Smoker  . Smokeless tobacco: Never Used  Substance Use Topics  . Alcohol use: Never  . Drug use: Never    Current Outpatient Medications  Medication Sig Dispense Refill  . albuterol (VENTOLIN HFA) 108 (90 Base) MCG/ACT inhaler SMARTSIG:1-2 Puff(s) Via Inhaler Every 4 Hours PRN    . allopurinol (ZYLOPRIM) 300 MG tablet Take 300 mg by mouth daily.    Marland Kitchen ALPRAZolam (XANAX) 0.5 MG tablet Take 0.5 mg by mouth as needed for anxiety.    . busPIRone (BUSPAR) 10 MG tablet Take 10 mg by mouth 2 (two) times daily.    . cloNIDine (CATAPRES) 0.3 MG tablet Take 0.3 mg by mouth 2 (two) times daily.    Marland Kitchen esomeprazole (NEXIUM) 40 MG capsule Take 1 capsule (40 mg total) by mouth daily at 12 noon. 30 capsule 6  . glipiZIDE-metformin (METAGLIP) 5-500 MG tablet TAKE 2 TABLETS TWICE DAILY BEFORE MEALS    . labetalol (NORMODYNE) 100 MG tablet Take 100 mg by mouth 2 (two) times daily.    Marland Kitchen levocetirizine (XYZAL) 5 MG tablet Take 5 mg by mouth every evening.    Marland Kitchen losartan (COZAAR) 25 MG tablet Take 25 mg by mouth daily.    . ondansetron (ZOFRAN) 4 MG tablet Take 4 mg by mouth every 8 (eight) hours as needed for nausea or vomiting.    Marland Kitchen oxyCODONE-acetaminophen (PERCOCET) 10-325 MG tablet Take 1 tablet by mouth every 6 (six) hours as needed for pain.    Marland Kitchen tiZANidine (ZANAFLEX) 4 MG tablet Take 4 mg by mouth every 6 (six) hours as needed for muscle spasms.    . traZODone (DESYREL) 100 MG tablet Take 100 mg by mouth at bedtime.     No current facility-administered medications for  this visit.    Allergies  Allergen Reactions  . Sulfa Antibiotics Shortness Of Breath  . Sulfamethoxazole Anaphylaxis  . Duloxetine Other (See Comments)    nervous   . Gabapentin Swelling  . Nsaids Other (See Comments)    Stomach burns   . Oseltamivir Swelling    Tongue swelling   . Prednisone   . Pregabalin Swelling    Tongue swells     Review of Systems:  Constitutional: Denies fever, chills, diaphoresis, appetite change and has fatigue.  HEENT: Denies photophobia, eye pain, redness, hearing loss, ear pain, congestion, sore throat, rhinorrhea, sneezing, mouth sores, neck pain, neck stiffness and tinnitus.   Respiratory: Denies SOB, DOE, cough,  chest tightness,  and wheezing.   Cardiovascular: Denies chest pain, palpitations and leg swelling.  Genitourinary: Denies dysuria, urgency, frequency, hematuria, flank pain and difficulty urinating.  Musculoskeletal: Has myalgias, back pain, joint swelling, arthralgias and gait problem.  Skin: No rash.  Neurological: Denies dizziness, seizures, syncope, weakness, light-headedness, numbness and headaches.  Hematological: Denies adenopathy. Easy bruising, personal or family bleeding history  Psychiatric/Behavioral: Has anxiety or depression     Physical Exam:    BP (!) 142/94   Pulse (!) 106   Temp (!) 97.3 F (36.3 C)   Ht 5' 5"  (1.651 m)   Wt 218 lb 8 oz (99.1 kg)   BMI 36.36 kg/m  Wt Readings from Last 3 Encounters:  05/09/20 218 lb 8 oz (99.1 kg)  01/20/20 219 lb (99.3 kg)  01/03/20 219 lb (99.3 kg)  Gen: awake, alert, NAD HEENT: anicteric, no pallor CV: RRR, no mrg Pulm: CTA b/l Abd: soft, generalized abdominal tenderness/abdominal wall tenderness, +BS throughout Ext: no c/c/e Neuro: nonfocal      Carmell Austria, MD 05/09/2020, 11:52 AM  Cc: Bonnita Nasuti, MD

## 2020-05-09 NOTE — Patient Instructions (Addendum)
If you are age 62 or older, your body mass index should be between 23-30. Your Body mass index is 36.36 kg/m. If this is out of the aforementioned range listed, please consider follow up with your Primary Care Provider.  If you are age 35 or younger, your body mass index should be between 19-25. Your Body mass index is 36.36 kg/m. If this is out of the aformentioned range listed, please consider follow up with your Primary Care Provider.   We have sent the following medications to your pharmacy for you to pick up at your convenience: Rifaximin 550 mg two times daily x 2 weeks.   After you have completed your course of Rifaximin please call our office at [phone] to have your UGI Series scheduled.   Thank you,  Dr. Jackquline Denmark

## 2020-05-16 ENCOUNTER — Other Ambulatory Visit: Payer: Self-pay | Admitting: Neurosurgery

## 2020-05-24 ENCOUNTER — Other Ambulatory Visit: Payer: Self-pay

## 2020-05-24 ENCOUNTER — Encounter (HOSPITAL_COMMUNITY)
Admission: RE | Admit: 2020-05-24 | Discharge: 2020-05-24 | Disposition: A | Discharge status: Home / Self Care | Payer: Medicare Other | Source: Ambulatory Visit | Attending: Neurosurgery | Admitting: Neurosurgery

## 2020-05-24 ENCOUNTER — Encounter (HOSPITAL_COMMUNITY): Payer: Self-pay

## 2020-05-24 ENCOUNTER — Other Ambulatory Visit (HOSPITAL_COMMUNITY)
Admission: RE | Admit: 2020-05-24 | Discharge: 2020-05-24 | Disposition: A | Discharge status: Home / Self Care | Payer: Medicare Other | Source: Ambulatory Visit | Attending: Neurosurgery | Admitting: Neurosurgery

## 2020-05-24 DIAGNOSIS — K760 Fatty (change of) liver, not elsewhere classified: Secondary | ICD-10-CM | POA: Insufficient documentation

## 2020-05-24 DIAGNOSIS — I10 Essential (primary) hypertension: Secondary | ICD-10-CM | POA: Insufficient documentation

## 2020-05-24 DIAGNOSIS — R1013 Epigastric pain: Secondary | ICD-10-CM | POA: Insufficient documentation

## 2020-05-24 DIAGNOSIS — E118 Type 2 diabetes mellitus with unspecified complications: Secondary | ICD-10-CM | POA: Insufficient documentation

## 2020-05-24 DIAGNOSIS — Z01818 Encounter for other preprocedural examination: Secondary | ICD-10-CM | POA: Insufficient documentation

## 2020-05-24 DIAGNOSIS — R112 Nausea with vomiting, unspecified: Secondary | ICD-10-CM | POA: Insufficient documentation

## 2020-05-24 DIAGNOSIS — D509 Iron deficiency anemia, unspecified: Secondary | ICD-10-CM | POA: Insufficient documentation

## 2020-05-24 DIAGNOSIS — E538 Deficiency of other specified B group vitamins: Secondary | ICD-10-CM | POA: Insufficient documentation

## 2020-05-24 DIAGNOSIS — Z20822 Contact with and (suspected) exposure to covid-19: Secondary | ICD-10-CM | POA: Insufficient documentation

## 2020-05-24 HISTORY — DX: Pneumonia, unspecified organism: J18.9

## 2020-05-24 HISTORY — DX: Unspecified osteoarthritis, unspecified site: M19.90

## 2020-05-24 HISTORY — DX: Tachycardia, unspecified: R00.0

## 2020-05-24 HISTORY — DX: Other specified postprocedural states: R11.2

## 2020-05-24 HISTORY — DX: Functional dyspepsia: K30

## 2020-05-24 HISTORY — DX: Fibromyalgia: M79.7

## 2020-05-24 LAB — SURGICAL PCR SCREEN
MRSA, PCR: NEGATIVE
Staphylococcus aureus: NEGATIVE

## 2020-05-24 LAB — HEMOGLOBIN A1C
Hgb A1c MFr Bld: 8.1 % — ABNORMAL HIGH (ref 4.8–5.6)
Mean Plasma Glucose: 185.77 mg/dL

## 2020-05-24 LAB — COMPREHENSIVE METABOLIC PANEL
ALT: 20 U/L (ref 0–44)
AST: 20 U/L (ref 15–41)
Albumin: 4.1 g/dL (ref 3.5–5.0)
Alkaline Phosphatase: 127 U/L — ABNORMAL HIGH (ref 38–126)
Anion gap: 10 (ref 5–15)
BUN: 16 mg/dL (ref 8–23)
CO2: 28 mmol/L (ref 22–32)
Calcium: 9.8 mg/dL (ref 8.9–10.3)
Chloride: 101 mmol/L (ref 98–111)
Creatinine, Ser: 0.92 mg/dL (ref 0.44–1.00)
GFR calc Af Amer: >60 mL/min (ref >60)
GFR calc non Af Amer: >60 mL/min (ref >60)
Glucose, Bld: 150 mg/dL — ABNORMAL HIGH (ref 70–99)
Potassium: 4 mmol/L (ref 3.5–5.1)
Sodium: 139 mmol/L (ref 135–145)
Total Bilirubin: 0.6 mg/dL (ref 0.3–1.2)
Total Protein: 7.5 g/dL (ref 6.5–8.1)

## 2020-05-24 LAB — CBC
HCT: 36.4 % (ref 36.0–46.0)
Hemoglobin: 11.2 g/dL — ABNORMAL LOW (ref 12.0–15.0)
MCH: 27.5 pg (ref 26.0–34.0)
MCHC: 30.8 g/dL (ref 30.0–36.0)
MCV: 89.4 fL (ref 80.0–100.0)
Platelets: 265 10*3/uL (ref 150–400)
RBC: 4.07 MIL/uL (ref 3.87–5.11)
RDW: 14.8 % (ref 11.5–15.5)
WBC: 9.5 10*3/uL (ref 4.0–10.5)
nRBC: 0 % (ref 0.0–0.2)

## 2020-05-24 LAB — SARS CORONAVIRUS 2 (TAT 6-24 HRS): SARS Coronavirus 2: NEGATIVE

## 2020-05-24 LAB — ABO/RH: ABO/RH(D): O POS

## 2020-05-24 LAB — TYPE AND SCREEN
ABO/RH(D): O POS
Antibody Screen: NEGATIVE

## 2020-05-24 LAB — GLUCOSE, CAPILLARY: Glucose-Capillary: 148 mg/dL — ABNORMAL HIGH (ref 70–99)

## 2020-05-24 NOTE — Progress Notes (Signed)
PCP - Irven Shelling, NP Cardiologist - denies  PPM/ICD - denies  Chest x-ray - N/A EKG - 05/24/2020 Stress Test - per patient more than 5 years ago - records requested ECHO - denies Cardiac Cath - per patent in 2011 - records requested  Sleep Study - denies CPAP - N/A  Blood Thinner Instructions: N/A Aspirin Instructions: N/A  ERAS Protcol - No  COVID TEST- Scheduled for today 05/24/2020 after PAT appointment. Patient verbalzied understanding of self-quarantine instructions, appointment time and place.  Anesthesia review: YES, abnormal ekg, records requested as well as PCP last two office notes  Patient denies shortness of breath, fever, cough and chest pain at PAT appointment  All instructions explained to the patient, with a verbal understanding of the material. Patient agrees to go over the instructions while at home for a better understanding. Patient also instructed to self quarantine after being tested for COVID-19. The opportunity to ask questions was provided.

## 2020-05-24 NOTE — Pre-Procedure Instructions (Signed)
Christy Long  05/24/2020     Your procedure is scheduled on Friday, May 26, 2020  Report to [facility] Admitting at 7:50 A.M.  Call this number if you have problems the morning of surgery:  [phone]   Remember: Brush your teeth the morning of surgery.   Do not eat or drink after midnight the night before surgery.   Take these medicines the morning of surgery with A SIP OF WATER:   allopurinol (ZYLOPRIM)   busPIRone (BUSPAR)  cloNIDine (CATAPRES)  esomeprazole (NEXIUM)  labetalol (NORMODYNE)  levothyroxine (SYNTHROID) If needed: tiZANidine (ZANAFLEX) for muscle spasms If needed: oxyCODONE-acetaminophen (PERCOCET) for pain If needed: ALPRAZolam Duanne Moron) If needed: albuterol (VENTOLIN HFA) 108 (90 Base) MCG/ACT inhaler for wheezing or shortness of breath ( Bring inhaler in with you )  Stop taking Aspirin (unless otherwise advised by surgeon), vitamins, fish oil and herbal medications. Do not take any NSAIDs ie: Ibuprofen, Advil, Naproxen (Aleve), Motrin, BC and Goody Powder; stop now.      How to Manage Your Diabetes Before and After Surgery  Why is it important to control my blood sugar before and after surgery? . Improving blood sugar levels before and after surgery helps healing and can limit problems. . A way of improving blood sugar control is eating a healthy diet by: o  Eating less sugar and carbohydrates o  Increasing activity/exercise o  Talking with your doctor about reaching your blood sugar goals . High blood sugars (greater than 180 mg/dL) can raise your risk of infections and slow your recovery, so you will need to focus on controlling your diabetes during the weeks before surgery. . Make sure that the doctor who takes care of your diabetes knows about your planned surgery including the date and location.  How do I manage my blood sugar before surgery? . Check your blood sugar at least 4 times a day, starting 2 days before surgery, to make sure  that the level is not too high or low. o Check your blood sugar the morning of your surgery when you wake up and every 2 hours until you get to the Short Stay unit. . If your blood sugar is less than 70 mg/dL, you will need to treat for low blood sugar: o Do not take insulin. o Treat a low blood sugar (less than 70 mg/dL) with  cup of clear juice (cranberry or apple), 4 glucose tablets, OR glucose gel. Recheck blood sugar in 15 minutes after treatment (to make sure it is greater than 70 mg/dL). If your blood sugar is not greater than 70 mg/dL on recheck, call [phone] o  for further instructions. . Report your blood sugar to the short stay nurse when you get to Short Stay.  . If you are admitted to the hospital after surgery: o Your blood sugar will be checked by the staff and you will probably be given insulin after surgery (instead of oral diabetes medicines) to make sure you have good blood sugar levels. o The goal for blood sugar control after surgery is 80-180 mg/dL.  WHAT DO I DO ABOUT MY DIABETES MEDICATION?  Do not take glipiZIDE (GLUCOTROL XL) the night before surgery.   . Do not take oral diabetes medicines (pills) the morning of surgery such as glipiZIDE (GLUCOTROL XL) and  metFORMIN (GLUCOPHAGE-XR)    Reviewed and Endorsed by [facility] Patient Education Committee, August 2015  Do not wear jewelry, make-up or nail polish.  Do not  wear lotions, powders, or perfumes, or deodorant.  Do not shave 48 hours prior to surgery.   Do not bring valuables to the hospital.  [facility] is not responsible for any belongings or valuables.  Contacts, dentures or bridgework may not be worn into surgery.  Leave your suitcase in the car.  After surgery it may be brought to your room.  For patients admitted to the hospital, discharge time will be determined by your treatment team.  Patients discharged the day of surgery will not be allowed to drive home.   Special instructions: See "  [facility] Preparing For Surgery " sheet.   Please read over the following fact sheets that you were given. Pain Booklet, Coughing and Deep Breathing and Surgical Site Infection Prevention

## 2020-05-24 NOTE — Progress Notes (Signed)
Abnormal labs have resulted. ]Dr. Kathyrn Sheriff notified via [employer].

## 2020-05-25 NOTE — H&P (Signed)
Chief Complaint   Back pain  HPI   HPI: Christy Long is a 64 y.o. female with history of prior left L5-S laminotomy and foraminotomy October 2019 who developed recurrent back and left leg pain several months ago.  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  While she did have mild improvement with medial branch blocks,  She elected to proceed with definitive treat due to the pain interfere with ADLs.  She presents today for L5-S1 decompression fusion.  She is without any concerns today.  She denies right lower extremity symptoms, bowel or bladder dysfunction.   There are no problems to display for this patient.   PMH: Past Medical History:  Diagnosis Date  . Allergic rhinitis   . Anxiety disorder   . Arthritis   . Chronic fatigue   . Chronic pain disorder   . Cramp and spasm   . Essential (primary) hypertension   . Fibromyalgia   . Gout   . Heart rate fast    per patient happens during anxiety   . Hyperlipemia, mixed   . Hypothyroidism   . IBS (irritable bowel syndrome)   . Indigestion   . Insomnia   . Low back pain   . Nontoxic single thyroid nodule   . Other vitamin B12 deficiency anemias   . Pneumonia   . PONV (postoperative nausea and vomiting)    per patient gets a N/V and headache upon waking up from anesthesia  . Type 2 diabetes mellitus ([place])   . Unspecified menopausal and perimenopausal disorder   . Vitamin D deficiency     PSH: Past Surgical History:  Procedure Laterality Date  . ANKLE SURGERY     x5  . BACK SURGERY    . BLADDER SURGERY  1999   bladder tack  . CESAREAN SECTION     x3  . CHOLECYSTECTOMY    . COLONOSCOPY WITH ESOPHAGOGASTRODUODENOSCOPY (EGD)  05/2012  . LUMBAR FUSION  09/2018  . SHOULDER SURGERY  2011  . TOTAL ABDOMINAL HYSTERECTOMY      No medications prior to admission.    SH: Social History   Tobacco Use  . Smoking status: Former Smoker    Quit date: 1994   Years since quitting: 27.4  . Smokeless tobacco: Never Used  Substance Use Topics  . Alcohol use: Never  . Drug use: Never    MEDS: Prior to Admission medications   Medication Sig Start Date End Date Taking? Authorizing Provider  albuterol (VENTOLIN HFA) 108 (90 Base) MCG/ACT inhaler Inhale 1-2 puffs into the lungs every 6 (six) hours as needed for wheezing or shortness of breath.  12/11/19  Yes [provider]  allopurinol (ZYLOPRIM) 300 MG tablet Take 300 mg by mouth daily.   Yes [provider]  ALPRAZolam Duanne Moron) 0.5 MG tablet Take 0.5 mg by mouth as needed for anxiety.   Yes [provider]  busPIRone (BUSPAR) 10 MG tablet Take 10 mg by mouth 2 (two) times daily.   Yes [provider]  cloNIDine (CATAPRES) 0.1 MG tablet Take 0.1 mg by mouth 3 (three) times daily.    Yes [provider]  esomeprazole (NEXIUM) 40 MG capsule Take 1 capsule (40 mg total) by mouth daily at 12 noon. 01/03/20  Yes Jackquline Denmark, MD  glipiZIDE (GLUCOTROL XL) 2.5 MG 24 hr tablet Take 2.5 mg by mouth in the morning and at bedtime.   Yes [provider]  labetalol (NORMODYNE) 100 MG tablet Take 100 mg by mouth 2 (two) times daily.   Yes [provider]  levocetirizine (XYZAL) 5 MG tablet Take 5 mg by mouth every evening.   Yes [provider]  levothyroxine (SYNTHROID) 100 MCG tablet Take 100 mcg by mouth daily before breakfast. 05/13/20  Yes [provider]  losartan (COZAAR) 50 MG tablet Take 50 mg by mouth in the morning and at bedtime.    Yes [provider]  metFORMIN (GLUCOPHAGE-XR) 500 MG 24 hr tablet Take 500 mg by mouth in the morning and at bedtime.   Yes [provider]  oxyCODONE-acetaminophen (PERCOCET) 10-325 MG tablet Take 1 tablet by mouth every 6 (six) hours as needed for pain.   Yes [provider]  tiZANidine (ZANAFLEX) 4 MG tablet Take 4 mg by mouth every 6 (six) hours as needed for muscle  spasms.   Yes [provider]  traZODone (DESYREL) 100 MG tablet Take 100 mg by mouth at bedtime.   Yes [provider]    ALLERGY: Allergies  Allergen Reactions  . Sulfa Antibiotics Shortness Of Breath  . Sulfamethoxazole Anaphylaxis  . Duloxetine Other (See Comments)    nervous   . Gabapentin Swelling  . Nsaids Other (See Comments)    Stomach burns   . Oseltamivir Swelling    Tongue swelling   . Prednisone   . Pregabalin Swelling    Tongue swells     Social History   Tobacco Use  . Smoking status: Former Smoker    Quit date: 1994    Years since quitting: 27.4  . Smokeless tobacco: Never Used  Substance Use Topics  . Alcohol use: Never     Family History  Problem Relation Age of Onset  . Diabetes Mother   . Colon cancer Neg Hx      ROS   ROS  Exam   There were no vitals filed for this visit. General appearance: WDWN, NAD  Eyes: No scleral injection Cardiovascular: Regular rate and rhythm without murmurs, rubs, gallops. No edema or variciosities. Distal pulses normal. Pulmonary: Effort normal, non-labored breathing Musculoskeletal:     Muscle tone upper extremities: Normal    Muscle tone lower extremities: Normal    Motor exam: Upper Extremities Deltoid Bicep Tricep Grip  Right 5/5 5/5 5/5 5/5  Left 5/5 5/5 5/5 5/5   Lower Extremity IP Quad PF DF EHL  Right 5/5 5/5 5/5 5/5 5/5  Left 5/5 5/5 5/5 5/5 5/5   Neurological Mental Status:    - Patient is awake, alert, oriented to person, place, month, year, and situation    - Patient is able to give a clear and coherent history.    - No signs of aphasia or neglect Cranial Nerves    - II: Visual Fields are full. PERRL    - III/IV/VI: EOMI without ptosis or diploplia.     - V: Facial sensation is grossly normal    - VII: Facial movement is symmetric.     - VIII: hearing is intact to voice    - X: Uvula elevates symmetrically    - XI: Shoulder shrug is symmetric.    - XII: tongue  is midline without atrophy or fasciculations.  Sensory: Sensation grossly intact to LT  Results - Imaging/Labs   Results for orders placed or performed during the hospital encounter of 05/24/20 (from the past 48 hour(s))  SARS CORONAVIRUS 2 (TAT 6-24 HRS) Nasopharyngeal Nasopharyngeal Swab  Status: None   Collection Time: 05/24/20  1:04 PM   Specimen: Nasopharyngeal Swab  Result Value Ref Range   SARS Coronavirus 2 NEGATIVE NEGATIVE    Comment: (NOTE) SARS-CoV-2 target nucleic acids are NOT DETECTED. The SARS-CoV-2 RNA is generally detectable in upper and lower respiratory specimens during the acute phase of infection. Negative results do not preclude SARS-CoV-2 infection, do not rule out co-infections with other pathogens, and should not be used as the sole basis for treatment or other patient management decisions. Negative results must be combined with clinical observations, patient history, and epidemiological information. The expected result is Negative. Fact Sheet for Patients: SugarRoll.be Fact Sheet for Healthcare Providers: https://www.woods-mathews.com/ This test is not yet approved or cleared by the [place] FDA and  has been authorized for detection and/or diagnosis of SARS-CoV-2 by FDA under an Emergency Use Authorization (EUA). This EUA will remain  in effect (meaning this test can be used) for the duration of the COVID-19 declaration under Section 56 4(b)(1) of the Act, 21 U.S.C. section 360bbb-3(b)(1), unless the authorization is terminated or revoked sooner. Performed at [facility], [place] [address] [place], [place] [address]     No results found.  IMAGING: MRI of the lumbar spine was personally reviewed.  This does demonstrate maintenance of normal lumbar lordosis.  There is some disc desiccation with minimal loss of height in the lumbar spine.  There is evidence of the prior laminotomy at L5-S1 with mild  left-sided foraminal stenosis, and no further lateral recess stenosis.   Impression/Plan   64 y.o. female nearly one and half years status post left L5-S1 laminotomy foraminotomy with worsening low back and left leg pain likely secondary to persistent left sided foraminal stenosis.   Will proceed with L5-S1 decompression and fusion.   We have reviewed the indications for surgery, risks, benefits, and alternatives at length in the office. All questions today were answered and consent was obtained.  Consuella Lose, MD [facility]

## 2020-05-25 NOTE — Progress Notes (Signed)
Anesthesia Chart Review:  Pt reports previous benign cardiology evaluation for chest pain ultimately determined to be noncardiac. Sress test 2010 was nonischemic.  Echo 2015 showed normal LVEF, normal valves, mod LA dilation.  She is followed by gastroenterology for chronic right upper quadrant/epigastric pain with nausea vomiting, GERD with intermittent esophageal dysphagia, IBS, fatty liver, iron deficiency anemia/B12 deficiency. Per last office visit note 05/09/2020, "chronic RUQ/epi pan with negative extensive work-up at [place] including EGD, colonoscopy 05/2012, MRCP 2018, Meckel's scan 2018, ultrasound, CT, negative EGD with SB biopsy and GES 12/2019.  H2 breath test suspicious for SIBO 03/11/2020."  At that visit she was started on rifaximin twice daily for 2 weeks as treatment for SIBO.  DM2 not well controlled, A1c 8.1 on preop labs.  Mild anemia with hemoglobin 11.2.  Remainder of preop labs unremarkable.  BP elevated at PAT 168/99. Pt advised to monitor and followup with PCP if remains elevated. She understands markedly elevated BP on DOS could be cause for cancellation.   EKG 05/24/20: Normal sinus rhythm. Rate 78. Poor anterior R wave progression  TTE 07/11/2014 (copy on chart): Conclusions: 1.  There is normal global left ventricular contractility. 2.  Overall left ventricular systolic function is normal with an EF between 55-60%. 3.  Left atrium is moderately dilated.  Nuclear stress 06/09/2009 [facility]): IMPRESSION: No ischemia or significant scar.   Normal wall motion and contractility. Left ventricle ejection fraction is 69%.   Wynonia Musty [facility] Short Stay Center/Anesthesiology Phone [phone] 05/25/2020 3:13 PM

## 2020-05-25 NOTE — Anesthesia Preprocedure Evaluation (Addendum)
Anesthesia Evaluation  Patient identified by MRN, date of birth, ID band Patient awake    Reviewed: Allergy & Precautions, NPO status , Patient's Chart, lab work & pertinent test results  History of Anesthesia Complications (+) PONV and history of anesthetic complications  Airway Mallampati: II  TM Distance: >3 FB Neck ROM: Full    Dental  (+) Dental Advisory Given   Pulmonary former smoker,    breath sounds clear to auscultation       Cardiovascular hypertension, Pt. on medications  Rhythm:Regular Rate:Normal     Neuro/Psych PSYCHIATRIC DISORDERS Anxiety    GI/Hepatic  IBS    Endo/Other  diabetes, Type 2, Oral Hypoglycemic AgentsHypothyroidism  Obesity   Renal/GU      Musculoskeletal  (+) Arthritis , Fibromyalgia -  Abdominal   Peds  Hematology   Anesthesia Other Findings Covid neg 6/2   Reproductive/Obstetrics  S/p hysterectomy                            Lab Results  Component Value Date   WBC 9.5 05/24/2020   HGB 11.2 (L) 05/24/2020   HCT 36.4 05/24/2020   MCV 89.4 05/24/2020   PLT 265 05/24/2020   Lab Results  Component Value Date   CREATININE 0.92 05/24/2020   BUN 16 05/24/2020   NA 139 05/24/2020   K 4.0 05/24/2020   CL 101 05/24/2020   CO2 28 05/24/2020    Anesthesia Physical Anesthesia Plan  ASA: III  Anesthesia Plan: General   Post-op Pain Management:    Induction: Intravenous  PONV Risk Score and Plan: 4 or greater and Treatment may vary due to age or medical condition, Ondansetron, Midazolam, Dexamethasone and Scopolamine patch - Pre-op  Airway Management Planned: Oral ETT  Additional Equipment: None  Intra-op Plan:   Post-operative Plan: Extubation in OR  Informed Consent: I have reviewed the patients History and Physical, chart, labs and discussed the procedure including the risks, benefits and alternatives for the proposed anesthesia with  the patient or authorized representative who has indicated his/her understanding and acceptance.     Dental advisory given  Plan Discussed with: CRNA and Anesthesiologist  Anesthesia Plan Comments:       Anesthesia Quick Evaluation

## 2020-05-26 ENCOUNTER — Inpatient Hospital Stay (HOSPITAL_COMMUNITY): Payer: Medicare Other | Admitting: Physician Assistant

## 2020-05-26 ENCOUNTER — Encounter (HOSPITAL_COMMUNITY): Payer: Self-pay | Admitting: Neurosurgery

## 2020-05-26 ENCOUNTER — Other Ambulatory Visit: Payer: Self-pay

## 2020-05-26 ENCOUNTER — Inpatient Hospital Stay (HOSPITAL_COMMUNITY): Payer: Medicare Other | Admitting: Anesthesiology

## 2020-05-26 ENCOUNTER — Inpatient Hospital Stay (HOSPITAL_COMMUNITY)
Admission: RE | Admit: 2020-05-26 | Discharge: 2020-05-30 | DRG: 460 | Disposition: A | Discharge status: Home Health | Payer: Medicare Other | Attending: Neurosurgery | Admitting: Neurosurgery

## 2020-05-26 ENCOUNTER — Inpatient Hospital Stay (HOSPITAL_COMMUNITY): Payer: Medicare Other

## 2020-05-26 ENCOUNTER — Encounter (HOSPITAL_COMMUNITY)
Admission: RE | Disposition: A | Discharge status: Home / Self Care | Payer: Self-pay | Source: Home / Self Care | Attending: Neurosurgery

## 2020-05-26 DIAGNOSIS — M1990 Unspecified osteoarthritis, unspecified site: Secondary | ICD-10-CM | POA: Diagnosis present

## 2020-05-26 DIAGNOSIS — I10 Essential (primary) hypertension: Secondary | ICD-10-CM | POA: Diagnosis present

## 2020-05-26 DIAGNOSIS — M797 Fibromyalgia: Secondary | ICD-10-CM | POA: Diagnosis present

## 2020-05-26 DIAGNOSIS — K589 Irritable bowel syndrome without diarrhea: Secondary | ICD-10-CM | POA: Diagnosis present

## 2020-05-26 DIAGNOSIS — Z79891 Long term (current) use of opiate analgesic: Secondary | ICD-10-CM | POA: Diagnosis not present

## 2020-05-26 DIAGNOSIS — M5116 Intervertebral disc disorders with radiculopathy, lumbar region: Principal | ICD-10-CM | POA: Diagnosis present

## 2020-05-26 DIAGNOSIS — E039 Hypothyroidism, unspecified: Secondary | ICD-10-CM | POA: Diagnosis present

## 2020-05-26 DIAGNOSIS — Z20822 Contact with and (suspected) exposure to covid-19: Secondary | ICD-10-CM | POA: Diagnosis present

## 2020-05-26 DIAGNOSIS — M479 Spondylosis, unspecified: Secondary | ICD-10-CM | POA: Diagnosis present

## 2020-05-26 DIAGNOSIS — E119 Type 2 diabetes mellitus without complications: Secondary | ICD-10-CM | POA: Diagnosis present

## 2020-05-26 DIAGNOSIS — Z7984 Long term (current) use of oral hypoglycemic drugs: Secondary | ICD-10-CM | POA: Diagnosis not present

## 2020-05-26 DIAGNOSIS — G4700 Insomnia, unspecified: Secondary | ICD-10-CM | POA: Diagnosis present

## 2020-05-26 DIAGNOSIS — E782 Mixed hyperlipidemia: Secondary | ICD-10-CM | POA: Diagnosis present

## 2020-05-26 DIAGNOSIS — Z87891 Personal history of nicotine dependence: Secondary | ICD-10-CM

## 2020-05-26 DIAGNOSIS — F419 Anxiety disorder, unspecified: Secondary | ICD-10-CM | POA: Diagnosis present

## 2020-05-26 DIAGNOSIS — Z79899 Other long term (current) drug therapy: Secondary | ICD-10-CM | POA: Diagnosis not present

## 2020-05-26 DIAGNOSIS — M5416 Radiculopathy, lumbar region: Secondary | ICD-10-CM | POA: Diagnosis present

## 2020-05-26 DIAGNOSIS — Z419 Encounter for procedure for purposes other than remedying health state, unspecified: Secondary | ICD-10-CM

## 2020-05-26 LAB — GLUCOSE, CAPILLARY
Glucose-Capillary: 148 mg/dL — ABNORMAL HIGH (ref 70–99)
Glucose-Capillary: 171 mg/dL — ABNORMAL HIGH (ref 70–99)
Glucose-Capillary: 286 mg/dL — ABNORMAL HIGH (ref 70–99)
Glucose-Capillary: 273 mg/dL — ABNORMAL HIGH (ref 70–99)

## 2020-05-26 SURGERY — POSTERIOR LUMBAR FUSION 1 LEVEL
Anesthesia: General | Site: Spine Lumbar

## 2020-05-26 MED ORDER — PHENOL 1.4 % MT LIQD: 1.0000 | OROMUCOSAL | Status: DC | PRN

## 2020-05-26 MED ORDER — LIDOCAINE 2% (20 MG/ML) 5 ML SYRINGE
INTRAMUSCULAR | Status: AC
  Filled 2020-05-26: qty 5

## 2020-05-26 MED ORDER — HYDROMORPHONE HCL 1 MG/ML IJ SOLN
0.2500 mg | INTRAMUSCULAR | Status: DC | PRN
  Administered 2020-05-26 (×3): 0.5 mg via INTRAVENOUS

## 2020-05-26 MED ORDER — MENTHOL 3 MG MT LOZG: 1.0000 | LOZENGE | OROMUCOSAL | Status: DC | PRN

## 2020-05-26 MED ORDER — CHLORHEXIDINE GLUCONATE 0.12 % MT SOLN
OROMUCOSAL | Status: AC
  Administered 2020-05-26: 15 mL via OROMUCOSAL
  Filled 2020-05-26: qty 15

## 2020-05-26 MED ORDER — DEXAMETHASONE SODIUM PHOSPHATE 10 MG/ML IJ SOLN
INTRAMUSCULAR | Status: AC
  Filled 2020-05-26: qty 1

## 2020-05-26 MED ORDER — CEFAZOLIN SODIUM-DEXTROSE 2-4 GM/100ML-% IV SOLN
INTRAVENOUS | Status: AC
  Filled 2020-05-26: qty 100

## 2020-05-26 MED ORDER — PANTOPRAZOLE SODIUM 40 MG PO TBEC
80.0000 mg | DELAYED_RELEASE_TABLET | Freq: Every day | ORAL | Status: DC
  Administered 2020-05-27 – 2020-05-29 (×3): 80 mg via ORAL
  Filled 2020-05-26 (×3): qty 2

## 2020-05-26 MED ORDER — ACETAMINOPHEN 650 MG RE SUPP: 650.0000 mg | RECTAL | Status: DC | PRN

## 2020-05-26 MED ORDER — HYDROCODONE-ACETAMINOPHEN 5-325 MG PO TABS
1.0000 | ORAL_TABLET | ORAL | Status: DC | PRN
  Administered 2020-05-29 (×2): 1 via ORAL
  Filled 2020-05-26 (×2): qty 1

## 2020-05-26 MED ORDER — FENTANYL CITRATE (PF) 250 MCG/5ML IJ SOLN
INTRAMUSCULAR | Status: AC
  Filled 2020-05-26: qty 5

## 2020-05-26 MED ORDER — LEVOTHYROXINE SODIUM 100 MCG PO TABS
100.0000 ug | ORAL_TABLET | Freq: Every day | ORAL | Status: DC
  Administered 2020-05-27 – 2020-05-30 (×4): 100 ug via ORAL
  Filled 2020-05-26 (×4): qty 1

## 2020-05-26 MED ORDER — ONDANSETRON HCL 4 MG/2ML IJ SOLN
INTRAMUSCULAR | Status: AC
  Filled 2020-05-26: qty 2

## 2020-05-26 MED ORDER — FENTANYL CITRATE (PF) 100 MCG/2ML IJ SOLN
25.0000 ug | INTRAMUSCULAR | Status: DC | PRN
  Administered 2020-05-26 (×2): 50 ug via INTRAVENOUS

## 2020-05-26 MED ORDER — ROCURONIUM BROMIDE 10 MG/ML (PF) SYRINGE
PREFILLED_SYRINGE | INTRAVENOUS | Status: DC | PRN
  Administered 2020-05-26: 20 mg via INTRAVENOUS
  Administered 2020-05-26: 60 mg via INTRAVENOUS
  Administered 2020-05-26: 20 mg via INTRAVENOUS

## 2020-05-26 MED ORDER — DOCUSATE SODIUM 100 MG PO CAPS
100.0000 mg | ORAL_CAPSULE | Freq: Two times a day (BID) | ORAL | Status: DC
  Administered 2020-05-26 – 2020-05-30 (×9): 100 mg via ORAL
  Filled 2020-05-26 (×9): qty 1

## 2020-05-26 MED ORDER — ORAL CARE MOUTH RINSE: 15.0000 mL | Freq: Once | OROMUCOSAL | Status: AC

## 2020-05-26 MED ORDER — LEVOCETIRIZINE DIHYDROCHLORIDE 5 MG PO TABS: 5.0000 mg | ORAL_TABLET | Freq: Every evening | ORAL | Status: DC

## 2020-05-26 MED ORDER — SODIUM CHLORIDE 0.9% FLUSH
3.0000 mL | Freq: Two times a day (BID) | INTRAVENOUS | Status: DC
  Administered 2020-05-26 – 2020-05-30 (×6): 3 mL via INTRAVENOUS

## 2020-05-26 MED ORDER — LIDOCAINE-EPINEPHRINE 1 %-1:100000 IJ SOLN
INTRAMUSCULAR | Status: AC
  Filled 2020-05-26: qty 1

## 2020-05-26 MED ORDER — METFORMIN HCL ER 500 MG PO TB24
500.0000 mg | ORAL_TABLET | Freq: Two times a day (BID) | ORAL | Status: DC
  Administered 2020-05-26 – 2020-05-30 (×8): 500 mg via ORAL
  Filled 2020-05-26 (×8): qty 1

## 2020-05-26 MED ORDER — HEMOSTATIC AGENTS (NO CHARGE) OPTIME
TOPICAL | Status: DC | PRN
  Administered 2020-05-26: 1 via TOPICAL

## 2020-05-26 MED ORDER — GLIPIZIDE ER 2.5 MG PO TB24
2.5000 mg | ORAL_TABLET | Freq: Two times a day (BID) | ORAL | Status: DC
  Administered 2020-05-26 – 2020-05-30 (×8): 2.5 mg via ORAL
  Filled 2020-05-26 (×8): qty 1

## 2020-05-26 MED ORDER — BISACODYL 10 MG RE SUPP
10.0000 mg | Freq: Every day | RECTAL | Status: DC | PRN
  Filled 2020-05-26: qty 1

## 2020-05-26 MED ORDER — ALPRAZOLAM 0.5 MG PO TABS
0.5000 mg | ORAL_TABLET | ORAL | Status: DC | PRN
  Administered 2020-05-26 – 2020-05-30 (×4): 0.5 mg via ORAL
  Filled 2020-05-26 (×4): qty 1

## 2020-05-26 MED ORDER — SCOPOLAMINE 1 MG/3DAYS TD PT72
1.0000 | MEDICATED_PATCH | TRANSDERMAL | Status: DC
  Administered 2020-05-26 – 2020-05-29 (×2): 1.5 mg via TRANSDERMAL
  Filled 2020-05-26 (×2): qty 1

## 2020-05-26 MED ORDER — CHLORHEXIDINE GLUCONATE CLOTH 2 % EX PADS: 6.0000 | MEDICATED_PAD | Freq: Once | CUTANEOUS | Status: DC

## 2020-05-26 MED ORDER — HYDROMORPHONE HCL 1 MG/ML IJ SOLN
INTRAMUSCULAR | Status: AC
  Filled 2020-05-26: qty 1

## 2020-05-26 MED ORDER — ACETAMINOPHEN 325 MG PO TABS
650.0000 mg | ORAL_TABLET | ORAL | Status: DC | PRN
  Administered 2020-05-27: 650 mg via ORAL
  Filled 2020-05-26: qty 2

## 2020-05-26 MED ORDER — SUGAMMADEX SODIUM 200 MG/2ML IV SOLN
INTRAVENOUS | Status: DC | PRN
  Administered 2020-05-26: 200 mg via INTRAVENOUS

## 2020-05-26 MED ORDER — 0.9 % SODIUM CHLORIDE (POUR BTL) OPTIME
TOPICAL | Status: DC | PRN
  Administered 2020-05-26: 1000 mL

## 2020-05-26 MED ORDER — SODIUM CHLORIDE 0.9 % IV SOLN
INTRAVENOUS | Status: DC | PRN
  Administered 2020-05-26: 500 mL

## 2020-05-26 MED ORDER — PHENYLEPHRINE 40 MCG/ML (10ML) SYRINGE FOR IV PUSH (FOR BLOOD PRESSURE SUPPORT)
PREFILLED_SYRINGE | INTRAVENOUS | Status: DC | PRN
  Administered 2020-05-26 (×3): 80 ug via INTRAVENOUS

## 2020-05-26 MED ORDER — ALBUTEROL SULFATE (2.5 MG/3ML) 0.083% IN NEBU: 3.0000 mL | INHALATION_SOLUTION | Freq: Four times a day (QID) | RESPIRATORY_TRACT | Status: DC | PRN

## 2020-05-26 MED ORDER — PHENYLEPHRINE HCL-NACL 10-0.9 MG/250ML-% IV SOLN
INTRAVENOUS | Status: DC | PRN
  Administered 2020-05-26: 25 ug/min via INTRAVENOUS

## 2020-05-26 MED ORDER — BUSPIRONE HCL 10 MG PO TABS
10.0000 mg | ORAL_TABLET | Freq: Two times a day (BID) | ORAL | Status: DC
  Administered 2020-05-26 – 2020-05-30 (×8): 10 mg via ORAL
  Filled 2020-05-26 (×8): qty 1

## 2020-05-26 MED ORDER — SENNOSIDES-DOCUSATE SODIUM 8.6-50 MG PO TABS: 1.0000 | ORAL_TABLET | Freq: Every evening | ORAL | Status: DC | PRN

## 2020-05-26 MED ORDER — LOSARTAN POTASSIUM 50 MG PO TABS
50.0000 mg | ORAL_TABLET | Freq: Two times a day (BID) | ORAL | Status: DC
  Administered 2020-05-26 – 2020-05-30 (×8): 50 mg via ORAL
  Filled 2020-05-26 (×9): qty 1

## 2020-05-26 MED ORDER — CEFAZOLIN SODIUM-DEXTROSE 2-4 GM/100ML-% IV SOLN
2.0000 g | INTRAVENOUS | Status: AC
  Administered 2020-05-26: 2 g via INTRAVENOUS

## 2020-05-26 MED ORDER — MIDAZOLAM HCL 5 MG/5ML IJ SOLN
INTRAMUSCULAR | Status: DC | PRN
  Administered 2020-05-26: 2 mg via INTRAVENOUS

## 2020-05-26 MED ORDER — HYDROMORPHONE HCL 1 MG/ML IJ SOLN
0.5000 mg | INTRAMUSCULAR | Status: DC | PRN
  Administered 2020-05-26 – 2020-05-27 (×3): 1 mg via INTRAVENOUS
  Filled 2020-05-26 (×3): qty 1

## 2020-05-26 MED ORDER — FENTANYL CITRATE (PF) 250 MCG/5ML IJ SOLN
INTRAMUSCULAR | Status: DC | PRN
  Administered 2020-05-26 (×2): 50 ug via INTRAVENOUS
  Administered 2020-05-26: 100 ug via INTRAVENOUS
  Administered 2020-05-26 (×4): 50 ug via INTRAVENOUS

## 2020-05-26 MED ORDER — PROMETHAZINE HCL 25 MG/ML IJ SOLN: 6.2500 mg | INTRAMUSCULAR | Status: DC | PRN

## 2020-05-26 MED ORDER — ONDANSETRON HCL 4 MG PO TABS: 4.0000 mg | ORAL_TABLET | Freq: Four times a day (QID) | ORAL | Status: DC | PRN

## 2020-05-26 MED ORDER — PROPOFOL 10 MG/ML IV BOLUS
INTRAVENOUS | Status: AC
  Filled 2020-05-26: qty 20

## 2020-05-26 MED ORDER — SODIUM CHLORIDE 0.9 % IV SOLN: INTRAVENOUS | Status: DC

## 2020-05-26 MED ORDER — THROMBIN 5000 UNITS EX SOLR
CUTANEOUS | Status: AC
  Filled 2020-05-26: qty 5000

## 2020-05-26 MED ORDER — PROPOFOL 10 MG/ML IV BOLUS
INTRAVENOUS | Status: DC | PRN
  Administered 2020-05-26: 200 mg via INTRAVENOUS

## 2020-05-26 MED ORDER — LORATADINE 10 MG PO TABS
10.0000 mg | ORAL_TABLET | Freq: Every evening | ORAL | Status: DC
  Administered 2020-05-26 – 2020-05-29 (×3): 10 mg via ORAL
  Filled 2020-05-26 (×4): qty 1

## 2020-05-26 MED ORDER — CLONIDINE HCL 0.1 MG PO TABS
0.1000 mg | ORAL_TABLET | Freq: Three times a day (TID) | ORAL | Status: DC
  Administered 2020-05-26 – 2020-05-30 (×11): 0.1 mg via ORAL
  Filled 2020-05-26 (×12): qty 1

## 2020-05-26 MED ORDER — MIDAZOLAM HCL 2 MG/2ML IJ SOLN
INTRAMUSCULAR | Status: AC
  Filled 2020-05-26: qty 2

## 2020-05-26 MED ORDER — SODIUM CHLORIDE 0.9% FLUSH: 3.0000 mL | INTRAVENOUS | Status: DC | PRN

## 2020-05-26 MED ORDER — LABETALOL HCL 100 MG PO TABS
100.0000 mg | ORAL_TABLET | Freq: Two times a day (BID) | ORAL | Status: DC
  Administered 2020-05-26 – 2020-05-30 (×7): 100 mg via ORAL
  Filled 2020-05-26 (×8): qty 1

## 2020-05-26 MED ORDER — LIDOCAINE-EPINEPHRINE 1 %-1:100000 IJ SOLN
INTRAMUSCULAR | Status: DC | PRN
  Administered 2020-05-26: 5 mL

## 2020-05-26 MED ORDER — ACETAMINOPHEN 500 MG PO TABS
1000.0000 mg | ORAL_TABLET | Freq: Once | ORAL | Status: AC
  Administered 2020-05-26: 1000 mg via ORAL
  Filled 2020-05-26: qty 2

## 2020-05-26 MED ORDER — ONDANSETRON HCL 4 MG/2ML IJ SOLN
INTRAMUSCULAR | Status: DC | PRN
  Administered 2020-05-26 (×2): 4 mg via INTRAVENOUS

## 2020-05-26 MED ORDER — TRAZODONE HCL 100 MG PO TABS
100.0000 mg | ORAL_TABLET | Freq: Every day | ORAL | Status: DC
  Administered 2020-05-26 – 2020-05-29 (×4): 100 mg via ORAL
  Filled 2020-05-26 (×4): qty 1

## 2020-05-26 MED ORDER — CEFAZOLIN SODIUM-DEXTROSE 2-4 GM/100ML-% IV SOLN
2.0000 g | Freq: Three times a day (TID) | INTRAVENOUS | Status: AC
  Administered 2020-05-26 (×2): 2 g via INTRAVENOUS
  Filled 2020-05-26 (×2): qty 100

## 2020-05-26 MED ORDER — FLEET ENEMA 7-19 GM/118ML RE ENEM: 1.0000 | ENEMA | Freq: Once | RECTAL | Status: DC | PRN

## 2020-05-26 MED ORDER — ROCURONIUM BROMIDE 10 MG/ML (PF) SYRINGE
PREFILLED_SYRINGE | INTRAVENOUS | Status: AC
  Filled 2020-05-26: qty 10

## 2020-05-26 MED ORDER — FENTANYL CITRATE (PF) 100 MCG/2ML IJ SOLN
INTRAMUSCULAR | Status: AC
  Filled 2020-05-26: qty 2

## 2020-05-26 MED ORDER — THROMBIN 5000 UNITS EX SOLR
OROMUCOSAL | Status: DC | PRN
  Administered 2020-05-26: 5 mL via TOPICAL

## 2020-05-26 MED ORDER — METHOCARBAMOL 1000 MG/10ML IJ SOLN
500.0000 mg | Freq: Four times a day (QID) | INTRAVENOUS | Status: DC | PRN
  Filled 2020-05-26: qty 5

## 2020-05-26 MED ORDER — ONDANSETRON HCL 4 MG/2ML IJ SOLN
4.0000 mg | Freq: Four times a day (QID) | INTRAMUSCULAR | Status: DC | PRN
  Administered 2020-05-26: 4 mg via INTRAVENOUS

## 2020-05-26 MED ORDER — OXYCODONE HCL 5 MG/5ML PO SOLN: 5.0000 mg | Freq: Once | ORAL | Status: DC | PRN

## 2020-05-26 MED ORDER — ACETAMINOPHEN 500 MG PO TABS
1000.0000 mg | ORAL_TABLET | Freq: Four times a day (QID) | ORAL | Status: AC
  Administered 2020-05-26 – 2020-05-27 (×3): 1000 mg via ORAL
  Filled 2020-05-26 (×4): qty 2

## 2020-05-26 MED ORDER — SENNA 8.6 MG PO TABS
1.0000 | ORAL_TABLET | Freq: Two times a day (BID) | ORAL | Status: DC
  Administered 2020-05-26 – 2020-05-30 (×8): 8.6 mg via ORAL
  Filled 2020-05-26 (×9): qty 1

## 2020-05-26 MED ORDER — LIDOCAINE 2% (20 MG/ML) 5 ML SYRINGE
INTRAMUSCULAR | Status: DC | PRN
  Administered 2020-05-26: 60 mg via INTRAVENOUS

## 2020-05-26 MED ORDER — BUPIVACAINE HCL (PF) 0.5 % IJ SOLN
INTRAMUSCULAR | Status: AC
  Filled 2020-05-26: qty 30

## 2020-05-26 MED ORDER — OXYCODONE HCL 5 MG PO TABS: 5.0000 mg | ORAL_TABLET | Freq: Once | ORAL | Status: DC | PRN

## 2020-05-26 MED ORDER — LACTATED RINGERS IV SOLN: INTRAVENOUS | Status: DC

## 2020-05-26 MED ORDER — ALLOPURINOL 100 MG PO TABS
300.0000 mg | ORAL_TABLET | Freq: Every day | ORAL | Status: DC
  Administered 2020-05-27 – 2020-05-30 (×4): 300 mg via ORAL
  Filled 2020-05-26: qty 3
  Filled 2020-05-26: qty 1
  Filled 2020-05-26 (×2): qty 3

## 2020-05-26 MED ORDER — DEXAMETHASONE SODIUM PHOSPHATE 10 MG/ML IJ SOLN
INTRAMUSCULAR | Status: DC | PRN
  Administered 2020-05-26: 10 mg via INTRAVENOUS

## 2020-05-26 MED ORDER — OXYCODONE HCL 5 MG PO TABS
5.0000 mg | ORAL_TABLET | ORAL | Status: DC | PRN
  Administered 2020-05-26 – 2020-05-27 (×4): 10 mg via ORAL
  Filled 2020-05-26 (×4): qty 2

## 2020-05-26 MED ORDER — CHLORHEXIDINE GLUCONATE 0.12 % MT SOLN: 15.0000 mL | Freq: Once | OROMUCOSAL | Status: AC

## 2020-05-26 MED ORDER — METHOCARBAMOL 500 MG PO TABS
500.0000 mg | ORAL_TABLET | Freq: Four times a day (QID) | ORAL | Status: DC | PRN
  Administered 2020-05-26 (×2): 500 mg via ORAL
  Filled 2020-05-26 (×2): qty 1

## 2020-05-26 MED ORDER — BUPIVACAINE HCL (PF) 0.5 % IJ SOLN
INTRAMUSCULAR | Status: DC | PRN
  Administered 2020-05-26: 5 mL

## 2020-05-26 MED ORDER — PHENYLEPHRINE 40 MCG/ML (10ML) SYRINGE FOR IV PUSH (FOR BLOOD PRESSURE SUPPORT)
PREFILLED_SYRINGE | INTRAVENOUS | Status: AC
  Filled 2020-05-26: qty 10

## 2020-05-26 SURGICAL SUPPLY — 83 items
ADH SKN CLS APL DERMABOND .7 (GAUZE/BANDAGES/DRESSINGS) ×1
APL SKNCLS STERI-STRIP NONHPOA (GAUZE/BANDAGES/DRESSINGS)
BAG DECANTER FOR FLEXI CONT (MISCELLANEOUS) ×3 IMPLANT
BASKET BONE COLLECTION (BASKET) ×3 IMPLANT
BENZOIN TINCTURE PRP APPL 2/3 (GAUZE/BANDAGES/DRESSINGS) IMPLANT
BLADE CLIPPER SURG (BLADE) IMPLANT
BLADE SURG 11 STRL SS (BLADE) ×3 IMPLANT
BUR MATCHSTICK NEURO 3.0 LAGG (BURR) ×3 IMPLANT
BUR PRECISION FLUTE 5.0 (BURR) ×3 IMPLANT
CANISTER SUCT 3000ML PPV (MISCELLANEOUS) ×3 IMPLANT
CARTRIDGE OIL MAESTRO DRILL (MISCELLANEOUS) ×1 IMPLANT
CLOSURE WOUND 1/2 X4 (GAUZE/BANDAGES/DRESSINGS)
CNTNR URN SCR LID CUP LEK RST (MISCELLANEOUS) ×1 IMPLANT
CONT SPEC 4OZ STRL OR WHT (MISCELLANEOUS) ×3
COVER BACK TABLE 60X90IN (DRAPES) ×3 IMPLANT
COVER WAND RF STERILE (DRAPES) ×1 IMPLANT
DECANTER SPIKE VIAL GLASS SM (MISCELLANEOUS) ×3 IMPLANT
DERMABOND ADVANCED (GAUZE/BANDAGES/DRESSINGS) ×2
DERMABOND ADVANCED .7 DNX12 (GAUZE/BANDAGES/DRESSINGS) ×1 IMPLANT
DEVICE INTERBODY ELEVATE 23X8 (Cage) ×6 IMPLANT
DIFFUSER DRILL AIR PNEUMATIC (MISCELLANEOUS) ×3 IMPLANT
DRAPE C-ARM 42X72 X-RAY (DRAPES) ×3 IMPLANT
DRAPE C-ARMOR (DRAPES) ×3 IMPLANT
DRAPE LAPAROTOMY 100X72X124 (DRAPES) ×3 IMPLANT
DRAPE SURG 17X23 STRL (DRAPES) ×3 IMPLANT
DRSG OPSITE POSTOP 4X8 (GAUZE/BANDAGES/DRESSINGS) ×2 IMPLANT
DURAPREP 26ML APPLICATOR (WOUND CARE) ×3 IMPLANT
ELECT REM PT RETURN 9FT ADLT (ELECTROSURGICAL) ×3
ELECTRODE REM PT RTRN 9FT ADLT (ELECTROSURGICAL) ×1 IMPLANT
GAUZE 4X4 16PLY RFD (DISPOSABLE) IMPLANT
GAUZE SPONGE 4X4 12PLY STRL (GAUZE/BANDAGES/DRESSINGS) IMPLANT
GLOVE BIO SURGEON STRL SZ 6.5 (GLOVE) ×1 IMPLANT
GLOVE BIO SURGEON STRL SZ7.5 (GLOVE) ×4 IMPLANT
GLOVE BIO SURGEONS STRL SZ 6.5 (GLOVE) ×1
GLOVE BIOGEL PI IND STRL 6.5 (GLOVE) IMPLANT
GLOVE BIOGEL PI IND STRL 7.5 (GLOVE) ×2 IMPLANT
GLOVE BIOGEL PI IND STRL 8 (GLOVE) IMPLANT
GLOVE BIOGEL PI INDICATOR 6.5 (GLOVE) ×2
GLOVE BIOGEL PI INDICATOR 7.5 (GLOVE) ×8
GLOVE BIOGEL PI INDICATOR 8 (GLOVE) ×10
GLOVE ECLIPSE 7.0 STRL STRAW (GLOVE) ×6 IMPLANT
GLOVE ECLIPSE 7.5 STRL STRAW (GLOVE) ×8 IMPLANT
GLOVE EXAM NITRILE XL STR (GLOVE) IMPLANT
GLOVE SURG SS PI 7.0 STRL IVOR (GLOVE) ×4 IMPLANT
GOWN STRL REUS W/ TWL LRG LVL3 (GOWN DISPOSABLE) ×4 IMPLANT
GOWN STRL REUS W/ TWL XL LVL3 (GOWN DISPOSABLE) IMPLANT
GOWN STRL REUS W/TWL 2XL LVL3 (GOWN DISPOSABLE) ×6 IMPLANT
GOWN STRL REUS W/TWL LRG LVL3 (GOWN DISPOSABLE) ×18
GOWN STRL REUS W/TWL XL LVL3 (GOWN DISPOSABLE)
HEMOSTAT POWDER KIT SURGIFOAM (HEMOSTASIS) ×3 IMPLANT
KIT BASIN OR (CUSTOM PROCEDURE TRAY) ×3 IMPLANT
KIT INFUSE XX SMALL 0.7CC (Orthopedic Implant) ×2 IMPLANT
KIT POSITION SURG JACKSON T1 (MISCELLANEOUS) ×3 IMPLANT
KIT TURNOVER KIT B (KITS) ×3 IMPLANT
MILL MEDIUM DISP (BLADE) ×3 IMPLANT
NDL HYPO 18GX1.5 BLUNT FILL (NEEDLE) IMPLANT
NDL SPNL 18GX3.5 QUINCKE PK (NEEDLE) IMPLANT
NEEDLE HYPO 18GX1.5 BLUNT FILL (NEEDLE) IMPLANT
NEEDLE HYPO 22GX1.5 SAFETY (NEEDLE) ×3 IMPLANT
NEEDLE SPNL 18GX3.5 QUINCKE PK (NEEDLE) ×3 IMPLANT
NS IRRIG 1000ML POUR BTL (IV SOLUTION) ×3 IMPLANT
OIL CARTRIDGE MAESTRO DRILL (MISCELLANEOUS) ×3
PACK LAMINECTOMY NEURO (CUSTOM PROCEDURE TRAY) ×3 IMPLANT
PAD ARMBOARD 7.5X6 YLW CONV (MISCELLANEOUS) ×9 IMPLANT
ROD COBALT 47.5X35 (Rod) ×4 IMPLANT
SCREW 5.5X35MM (Screw) ×6 IMPLANT
SCREW BN 35X5.5XMA NS SPNE (Screw) IMPLANT
SCREW MA THRD TI 6.5X30 (Screw) ×4 IMPLANT
SCREW SET SOLERA (Screw) ×12 IMPLANT
SCREW SET SOLERA TI (Screw) IMPLANT
SPACER SPNL XLORDOTIC 23X8X (Cage) IMPLANT
SPCR SPNL XLORDOTIC 23X8X (Cage) ×2 IMPLANT
SPONGE LAP 4X18 RFD (DISPOSABLE) ×2 IMPLANT
SPONGE SURGIFOAM ABS GEL 100 (HEMOSTASIS) ×2 IMPLANT
STRIP CLOSURE SKIN 1/2X4 (GAUZE/BANDAGES/DRESSINGS) IMPLANT
SUT VIC AB 0 CT1 18XCR BRD8 (SUTURE) ×1 IMPLANT
SUT VIC AB 0 CT1 8-18 (SUTURE) ×9
SUT VICRYL 3-0 RB1 18 ABS (SUTURE) ×5 IMPLANT
SYR 3ML LL SCALE MARK (SYRINGE) ×9 IMPLANT
TOWEL GREEN STERILE (TOWEL DISPOSABLE) ×3 IMPLANT
TOWEL GREEN STERILE FF (TOWEL DISPOSABLE) ×3 IMPLANT
TRAY FOLEY MTR SLVR 16FR STAT (SET/KITS/TRAYS/PACK) ×3 IMPLANT
WATER STERILE IRR 1000ML POUR (IV SOLUTION) ×3 IMPLANT

## 2020-05-26 NOTE — Progress Notes (Signed)
Orthopedic Tech Progress Note Patient Details:  Christy Long Jan 05, 1956 [id number]  Called in [place] for back brace  Tammy Sours 05/26/2020, 3:12 PM

## 2020-05-26 NOTE — Anesthesia Postprocedure Evaluation (Signed)
Anesthesia Post Note  Patient: HEDI BARKAN  Procedure(s) Performed: POSTERIOR LUMBAR INTERBODY FUSION LUMBAR FIVE- SACRAL ONE, INTERBODY ARTHRODESIS, POSTERIOR NON-EGMENTAL INSTRUMENTATION (N/A Spine Lumbar)     Patient location during evaluation: PACU Anesthesia Type: General Level of consciousness: awake and alert Pain management: pain level controlled Vital Signs Assessment: post-procedure vital signs reviewed and stable Respiratory status: spontaneous breathing, nonlabored ventilation, respiratory function stable and patient connected to nasal cannula oxygen Cardiovascular status: blood pressure returned to baseline and stable Postop Assessment: no apparent nausea or vomiting Anesthetic complications: no    Last Vitals:  Vitals:   05/26/20 1447 05/26/20 1522  BP: (!) 154/77 (!) 168/97  Pulse: 73 79  Resp: 10 18  Temp: 36.5 C 36.7 C  SpO2: 97% 99%    Last Pain:  Vitals:   05/26/20 1522  TempSrc: Oral  PainSc:                  Tiajuana Amass

## 2020-05-26 NOTE — Anesthesia Procedure Notes (Signed)
Procedure Name: Intubation Date/Time: 05/26/2020 10:38 AM Performed by: Candis Shine, CRNA Pre-anesthesia Checklist: Patient identified, Emergency Drugs available, Suction available and Patient being monitored Patient Re-evaluated:Patient Re-evaluated prior to induction Oxygen Delivery Method: Circle System Utilized Preoxygenation: Pre-oxygenation with 100% oxygen Induction Type: IV induction Ventilation: Mask ventilation without difficulty Laryngoscope Size: Mac and 3 Grade View: Grade I Tube type: Oral Tube size: 7.0 mm Number of attempts: 1 Airway Equipment and Method: Stylet Placement Confirmation: ETT inserted through vocal cords under direct vision,  positive ETCO2 and breath sounds checked- equal and bilateral Secured at: 22 cm Tube secured with: Tape Dental Injury: Teeth and Oropharynx as per pre-operative assessment  Comments: Intubation by Sam, paramedic student.

## 2020-05-26 NOTE — Transfer of Care (Signed)
Immediate Anesthesia Transfer of Care Note  Patient: Christy Long  Procedure(s) Performed: POSTERIOR LUMBAR INTERBODY FUSION LUMBAR FIVE- SACRAL ONE, INTERBODY ARTHRODESIS, POSTERIOR NON-EGMENTAL INSTRUMENTATION (N/A Spine Lumbar)  Patient Location: PACU  Anesthesia Type:General  Level of Consciousness: awake, alert  and oriented  Airway & Oxygen Therapy: Patient Spontanous Breathing and Patient connected to face mask oxygen  Post-op Assessment: Report given to RN and Post -op Vital signs reviewed and stable  Post vital signs: Reviewed and stable  Last Vitals:  Vitals Value Taken Time  BP    Temp    Pulse    Resp    SpO2      Last Pain:  Vitals:   05/26/20 0810  TempSrc:   PainSc: 9          Complications: No apparent anesthesia complications

## 2020-05-26 NOTE — Op Note (Signed)
NEUROSURGERY OPERATIVE NOTE   PREOP DIAGNOSIS:  1. Lumbago with radiculopathy, L5-S1 2. Recurrent lumbar disc herniation, L5-S1  POSTOP DIAGNOSIS: Same  PROCEDURE: 1. L5 laminectomy with facetectomy for decompression of exiting nerve roots, more than would be required for placement of interbody graft 2. Placement of anterior interbody device - Medtronic Rise expandable cage, 50mm lordotic x2 3. Posterior non-segmental instrumentation using cortical pedicle screws at L5 - S1: Medtronic Solera 5.5 x 29mm @ L5, 6.5 x 9mm @ S1 4. Interbody arthrodesis, L5-S1 5. Use of locally harvested bone autograft 6. Use of non-structural bone allograft - BMP  SURGEON: Dr. Consuella Lose, MD  ASSISTANT: Ferne Reus, PA-C  ANESTHESIA: General Endotracheal  EBL: 125cc  SPECIMENS: None  DRAINS: None  COMPLICATIONS: None immediate  CONDITION: Hemodynamically stable to PACU  HISTORY: Christy Long is a 64 y.o. female who has been followed in the outpatient clinic with back and leg pain related to left-sided disc herniation at L5-S1. She previously underwent laminotomy and microdiscectomy. She had good pain relief for a few months, with recurrence of her back and left-sided leg pain. Multiple conservative treatments were attempted without significant improvement and we ultimately elected to proceed with surgical decompression and fusion. Risks, benefits, and alternative treatments were reviewed in detail in the office. After all questions were answered, informed consent was obtained and witnessed.  PROCEDURE IN DETAIL: The patient was brought to the operating room via stretcher. After induction of general anesthesia, the patient was positioned on the operative table in the prone position. All pressure points were meticulously padded. The previous skin incision was then marked out and prepped and draped in the usual sterile fashion.  After timeout was conducted, skin was infiltrated with  local anesthetic. Skin incision was then made sharply and Bovie electrocautery was used to dissect the subcutaneous tissue until the lumbodorsal fascia was identified and incised. The muscle was then elevated in the subperiosteal plane and the L5 lamina and bilateral L5-S1 facet complexes were identified. I was also able to identify the left-sided laminotomy defect. Self-retaining retractors were then placed. Lateral fluoroscopy was taken with a dissector in the L5-S1 interspace to confirm our location.  At this point attention was turned to decompression. Complete  L5 laminectomy was completed with a high-speed drill and Kerrison punches.  Normal dura was identified.  A ball-tipped dissector was then used to identify the foramina bilaterally.  High-speed drill was used to cut across the pars interarticularis and the inferior articulating process of L5 was removed bilaterally.  The exiting nerve roots and the traversing nerve roots were then identified. There was a fair amount of epidural fibrosis on the lateral aspect of the left S1 nerve root. Sharp dissection was used to dissect this away from the medial left S1 pedicle. I was unable to dissect in the ventral epidural space. The remainder of the medial aspect and superior aspect of the superior articulating processes of S1 were then removed bilaterally. This allowed access to the entire L5-S1 disc space. I did note a disc bulge in the left foramen likely compressing the exiting left L5 nerve root. Once the foramen was unroofed I was able to easily pass a ball dissector in the ventral epidural space and underneath the bilateral L5 and S1 nerves indicating good decompression.   Disc space was then identified, incised bilaterally, and using a combination of shavers, curettes and rongeurs, complete discectomy was completed. Endplates were prepared, and bone harvested during decompression was mixed with BMP and  packed into the interspace. A 8 mm lordotic cage was  tapped into place bilaterally. Cages were expanded to 12 mm.  Good position was confirmed with fluoroscopy.  At this point, the entry points for bilateral L5 and S1 cortical pedicle screws were identified using standard anatomic landmarks and lateral fluoro. Pilot holes were then drilled and tapped to 5.5 x 35 mm at L5 and 5.5 x 30 mm at S1. Screws were then placed in L5 bilaterally measuring 5.5 x 35 mm, and bilaterally at S1 measuring 6.5 x 30 mm. Prebent lordotic rod was then sized and placed into the pedicle screws. Set screws were placed and final tightened. Final AP and lateral fluoroscopic images confirmed good position.  Hemostasis was secured and confirmed with bipolar cautery and morcellized gelfoam with thrombin. The wound was then irrigated with copious amounts of antibiotic saline, then closed in standard fashion using a combination of interrupted 0 and 3-0 Vicryl stitches in the muscular, fascial, and subcutaneous layers. Skin was then closed using standard Dermabond. Sterile dressing was then applied. The patient was then transferred to the stretcher, extubated, and taken to the postanesthesia care unit in stable hemodynamic condition.  At the end of the case all sponge, needle, cottonoid, and instrument counts were correct.

## 2020-05-26 NOTE — Progress Notes (Signed)
Orthopedic Tech Progress Note Patient Details:  Christy Long [birth year] [id number] Called in order to [facility] for an [place] Patient ID: Christy Long, female   DOB: 07-14-1956, 64 y.o.   MRN: [id number]   Janit Pagan 05/26/2020, 3:11 PM

## 2020-05-27 LAB — BASIC METABOLIC PANEL
Anion gap: 8 (ref 5–15)
BUN: 22 mg/dL (ref 8–23)
CO2: 26 mmol/L (ref 22–32)
Calcium: 8.6 mg/dL — ABNORMAL LOW (ref 8.9–10.3)
Chloride: 102 mmol/L (ref 98–111)
Creatinine, Ser: 1.03 mg/dL — ABNORMAL HIGH (ref 0.44–1.00)
GFR calc Af Amer: >60 mL/min (ref >60)
GFR calc non Af Amer: 58 mL/min — ABNORMAL LOW (ref >60)
Glucose, Bld: 197 mg/dL — ABNORMAL HIGH (ref 70–99)
Potassium: 4.5 mmol/L (ref 3.5–5.1)
Sodium: 136 mmol/L (ref 135–145)

## 2020-05-27 LAB — CBC
HCT: 29.8 % — ABNORMAL LOW (ref 36.0–46.0)
Hemoglobin: 9.3 g/dL — ABNORMAL LOW (ref 12.0–15.0)
MCH: 27.7 pg (ref 26.0–34.0)
MCHC: 31.2 g/dL (ref 30.0–36.0)
MCV: 88.7 fL (ref 80.0–100.0)
Platelets: 234 10*3/uL (ref 150–400)
RBC: 3.36 MIL/uL — ABNORMAL LOW (ref 3.87–5.11)
RDW: 14.7 % (ref 11.5–15.5)
WBC: 15.7 10*3/uL — ABNORMAL HIGH (ref 4.0–10.5)
nRBC: 0 % (ref 0.0–0.2)

## 2020-05-27 LAB — APTT: aPTT: 32 seconds (ref 24–36)

## 2020-05-27 LAB — PROTIME-INR
INR: 1.1 (ref 0.8–1.2)
Prothrombin Time: 13.3 seconds (ref 11.4–15.2)

## 2020-05-27 LAB — GLUCOSE, CAPILLARY
Glucose-Capillary: 274 mg/dL — ABNORMAL HIGH (ref 70–99)
Glucose-Capillary: 166 mg/dL — ABNORMAL HIGH (ref 70–99)
Glucose-Capillary: 186 mg/dL — ABNORMAL HIGH (ref 70–99)
Glucose-Capillary: 158 mg/dL — ABNORMAL HIGH (ref 70–99)

## 2020-05-27 MED ORDER — MORPHINE SULFATE (PF) 2 MG/ML IV SOLN
1.0000 mg | INTRAVENOUS | Status: DC | PRN
  Administered 2020-05-27 – 2020-05-28 (×4): 1 mg via INTRAVENOUS
  Filled 2020-05-27 (×4): qty 1

## 2020-05-27 MED ORDER — MAGNESIUM CITRATE PO SOLN: 1.0000 | Freq: Two times a day (BID) | ORAL | Status: DC | PRN

## 2020-05-27 MED ORDER — OXYCODONE-ACETAMINOPHEN 5-325 MG PO TABS
1.0000 | ORAL_TABLET | ORAL | Status: DC | PRN
  Administered 2020-05-27: 1 via ORAL
  Administered 2020-05-27 – 2020-05-30 (×10): 2 via ORAL
  Filled 2020-05-27 (×11): qty 2

## 2020-05-27 MED ORDER — HYDROMORPHONE HCL 1 MG/ML IJ SOLN
1.0000 mg | INTRAMUSCULAR | Status: DC | PRN
  Filled 2020-05-27: qty 2

## 2020-05-27 MED ORDER — TIZANIDINE HCL 4 MG PO TABS
4.0000 mg | ORAL_TABLET | Freq: Four times a day (QID) | ORAL | Status: DC | PRN
  Administered 2020-05-27 – 2020-05-30 (×10): 4 mg via ORAL
  Filled 2020-05-27 (×10): qty 1

## 2020-05-27 MED ORDER — MORPHINE SULFATE (PF) 2 MG/ML IV SOLN: 2.0000 mg | INTRAVENOUS | Status: DC | PRN

## 2020-05-27 MED ORDER — INSULIN ASPART 100 UNIT/ML ~~LOC~~ SOLN: 0.0000 [IU] | Freq: Every day | SUBCUTANEOUS | Status: DC

## 2020-05-27 MED ORDER — MORPHINE SULFATE (PF) 2 MG/ML IV SOLN
2.0000 mg | INTRAVENOUS | Status: DC | PRN
  Administered 2020-05-27: 4 mg via INTRAVENOUS
  Filled 2020-05-27: qty 2

## 2020-05-27 MED ORDER — KETOROLAC TROMETHAMINE 30 MG/ML IJ SOLN
30.0000 mg | Freq: Four times a day (QID) | INTRAMUSCULAR | Status: DC
  Administered 2020-05-27: 30 mg via INTRAVENOUS
  Filled 2020-05-27: qty 1

## 2020-05-27 MED ORDER — INSULIN ASPART 100 UNIT/ML ~~LOC~~ SOLN
0.0000 [IU] | Freq: Three times a day (TID) | SUBCUTANEOUS | Status: DC
  Administered 2020-05-27 (×2): 3 [IU] via SUBCUTANEOUS
  Administered 2020-05-28: 5 [IU] via SUBCUTANEOUS
  Administered 2020-05-28 – 2020-05-29 (×2): 3 [IU] via SUBCUTANEOUS
  Administered 2020-05-29: 5 [IU] via SUBCUTANEOUS
  Administered 2020-05-29 – 2020-05-30 (×2): 3 [IU] via SUBCUTANEOUS

## 2020-05-27 NOTE — Evaluation (Signed)
Occupational Therapy Evaluation Patient Details Name: Christy Long MRN: [id number] DOB: [birth year] Today's Date: 05/27/2020    History of Present Illness Pt is a 64 y/o female s/p PLIF L5-S1. PMH including but not limited to DM, HTN and fibromyalgia.   Clinical Impression   Pt PTA: lives with her daughter who is disabled and pt reports that "they help each other." Pt currently with deficits in strength, increased pain and decreased ability to care for self. Pt requires increased time for all functional tasks. Pt education on hip kit and written down information on handout provided. Pt with weaker R > L LE so pt advised to donn R side first. Pt minA overall for ADL with AE and minguardA for mobility with RW. Back handout provided and reviewed ADL in detail. Pt educated on: clothing between brace, never sleep in brace, set an alarm at night for medication, avoid sitting for long periods of time, correct bed positioning for sleeping, correct sequence for bed mobility, avoiding lifting more than 5 pounds and never wash directly over incision. All education is complete and patient indicates understanding. Pt able to state 3/3 precautions. Pt would greatly benefit from continued OT skilled services. OT following acutely.  ** pt plans to have 2 sons and sister alternate times to assist her and her daughter at home.      Follow Up Recommendations  Home health OT;Supervision - Intermittent    Equipment Recommendations  3 in 1 bedside commode;Tub/shower bench(pt realizes that she will have to buy tub bench)    Recommendations for Other Services       Precautions / Restrictions Precautions Precautions: Back;Fall Precaution Booklet Issued: Yes (comment) Precaution Comments: reviewed handout with pt throughout Required Braces or Orthoses: Spinal Brace Spinal Brace: Lumbar corset;Applied in sitting position Restrictions Weight Bearing Restrictions: No      Mobility Bed Mobility Overal bed  mobility: Needs Assistance Bed Mobility: Sit to Sidelying Rolling: Supervision Sidelying to sit: Supervision     Sit to sidelying: Min assist General bed mobility comments: greatly increased time and effort needed, use of bed rail, assist for BLEs; cues for log roll  Transfers Overall transfer level: Needs assistance Equipment used: Rolling walker (2 wheeled) Transfers: Sit to/from Stand Sit to Stand: Min guard         General transfer comment: cueing for safe hand placement, min guard for stability with transition    Balance Overall balance assessment: Needs assistance Sitting-balance support: Feet supported Sitting balance-Leahy Scale: Fair     Standing balance support: During functional activity;Single extremity supported;Bilateral upper extremity supported Standing balance-Leahy Scale: Poor                             ADL either performed or assessed with clinical judgement   ADL Overall ADL's : Needs assistance/impaired Eating/Feeding: Modified independent;Sitting   Grooming: Supervision/safety;Standing Grooming Details (indicate cue type and reason): stood at sink x6 mins for grooming, discomfort and groans in standing Upper Body Bathing: Supervision/ safety;Sitting   Lower Body Bathing: Minimal assistance;Cueing for safety;Cueing for sequencing;Sitting/lateral leans;Sit to/from stand;Adhering to back precautions Lower Body Bathing Details (indicate cue type and reason): Requires assist and could use LH sponge- education provided Upper Body Dressing : Minimal assistance;Standing Upper Body Dressing Details (indicate cue type and reason): minA to donn brace Lower Body Dressing: Min guard;With adaptive equipment;Cueing for safety;Sitting/lateral leans;Sit to/from stand Lower Body Dressing Details (indicate cue type and reason): Able to donn  underwear and socks with AE; pt pulling underwear to waist up/down for toilet transfer and hygiene. Toilet Transfer:  Min guard;Ambulation;RW;Grab bars;Regular Museum/gallery exhibitions officer and Hygiene: Min guard;Cueing for safety;Adhering to back precautions;Sitting/lateral lean;Sit to/from stand       Functional mobility during ADLs: Min guard;Rolling walker;Cueing for safety General ADL Comments: Pt with deficits in severe pain, decreased ability to care for self and decreased mobility. Pt education on hip kit and written down information on handout provided. Pt with weaker R > L LE so pt advisedto donn R side first.     Vision Baseline Vision/History: Wears glasses Wears Glasses: Distance only Patient Visual Report: No change from baseline Vision Assessment?: No apparent visual deficits     Perception     Praxis      Pertinent Vitals/Pain Pain Assessment: Faces Faces Pain Scale: Hurts even more Pain Location: R LE buttock Pain Descriptors / Indicators: Spasm Pain Intervention(s): Monitored during session     Hand Dominance Right   Extremity/Trunk Assessment Upper Extremity Assessment Upper Extremity Assessment: Generalized weakness   Lower Extremity Assessment Lower Extremity Assessment: Generalized weakness;Defer to PT evaluation   Cervical / Trunk Assessment Cervical / Trunk Assessment: Other exceptions Cervical / Trunk Exceptions: s/p lumbar sx   Communication Communication Communication: No difficulties   Cognition Arousal/Alertness: Awake/alert Behavior During Therapy: Flat affect Overall Cognitive Status: Within Functional Limits for tasks assessed                                     General Comments  Pt very lethargic and looks unwell from pain    Exercises     Shoulder Instructions      Home Living Family/patient expects to be discharged to:: Private residence Living Arrangements: Children Available Help at Discharge: Family;Available PRN/intermittently Type of Home: House Home Access: Ramped entrance     Home Layout: One level      Bathroom Shower/Tub: Teacher, early years/pre: Standard     Home Equipment: Cane - single point;Adaptive equipment Adaptive Equipment: Reacher        Prior Functioning/Environment Level of Independence: Independent with assistive device(s)        Comments: ambulated with use of a cane        OT Problem List: Decreased strength;Decreased activity tolerance;Impaired balance (sitting and/or standing);Pain;Decreased knowledge of use of DME or AE;Decreased knowledge of precautions      OT Treatment/Interventions: Self-care/ADL training;Therapeutic exercise;Energy conservation;DME and/or AE instruction;Therapeutic activities;Patient/family education;Balance training    OT Goals(Current goals can be found in the care plan section) Acute Rehab OT Goals Patient Stated Goal: "go home tomorrow" OT Goal Formulation: With patient Time For Goal Achievement: 06/10/20 Potential to Achieve Goals: Good ADL Goals Pt Will Perform Upper Body Dressing: with set-up;standing Pt Will Perform Lower Body Dressing: with set-up;sitting/lateral leans;sit to/from stand;with adaptive equipment Pt Will Perform Tub/Shower Transfer: with min guard assist;ambulating;Tub transfer  OT Frequency: Min 2X/week   Barriers to D/C:            Co-evaluation              AM-PAC OT "6 Clicks" Daily Activity     Outcome Measure Help from another person eating meals?: None Help from another person taking care of personal grooming?: A Little Help from another person toileting, which includes using toliet, bedpan, or urinal?: A Little Help from another person bathing (including  washing, rinsing, drying)?: A Little Help from another person to put on and taking off regular upper body clothing?: A Little Help from another person to put on and taking off regular lower body clothing?: A Little 6 Click Score: 19   End of Session Equipment Utilized During Treatment: Rolling walker;Back brace Nurse  Communication: Mobility status  Activity Tolerance: Patient tolerated treatment well;Patient limited by pain Patient left: in bed;with call bell/phone within reach;with bed alarm set  OT Visit Diagnosis: Unsteadiness on feet (R26.81);Muscle weakness (generalized) (M62.81);Pain Pain - Right/Left: Right Pain - part of body: Leg(and back)                Time: 0827-0900 OT Time Calculation (min): 33 min Charges:  OT General Charges $OT Visit: 1 Visit OT Evaluation $OT Eval Moderate Complexity: 1 Mod OT Treatments $Self Care/Home Management : 8-22 mins  Jefferey Pica, OTR/L Acute Rehabilitation Services Pager: [phone] Office: [phone]   Maureen Delatte C 05/27/2020, 10:24 AM

## 2020-05-27 NOTE — Progress Notes (Signed)
Subjective: Patient reports a lot of back pain and radicular pain down her right leg, states she hardly slept last night and it has been "rough."  Objective: Vital signs in last 24 hours: Temp:  [97.7 F (36.5 C)-98.6 F (37 C)] 98.1 F (36.7 C) (06/05 0807) Pulse Rate:  [73-97] 89 (06/05 0807) Resp:  [10-20] 16 (06/05 0807) BP: (84-176)/(59-97) 84/59 (06/05 0807) SpO2:  [92 %-99 %] 95 % (06/05 0807)  Intake/Output from previous day: 06/04 0701 - 06/05 0700 In: 1900 [I.V.:1600; IV Piggyback:300] Out: 660 [Urine:535; Blood:125] Intake/Output this shift: No intake/output data recorded.  Neurologic: Grossly normal  Lab Results: Lab Results  Component Value Date   WBC 9.5 05/24/2020   HGB 11.2 (L) 05/24/2020   HCT 36.4 05/24/2020   MCV 89.4 05/24/2020   PLT 265 05/24/2020   No results found for: INR, PROTIME BMET Lab Results  Component Value Date   NA 139 05/24/2020   K 4.0 05/24/2020   CL 101 05/24/2020   CO2 28 05/24/2020   GLUCOSE 150 (H) 05/24/2020   BUN 16 05/24/2020   CREATININE 0.92 05/24/2020   CALCIUM 9.8 05/24/2020    Studies/Results: DG Lumbar Spine 2-3 Views  Result Date: 05/26/2020 CLINICAL DATA:  L5-S1 fusion EXAM: LUMBAR SPINE - 2-3 VIEW; DG C-ARM 1-60 MIN COMPARISON:  MRI 02/15/2020 FINDINGS: Posterior fusion changes noted at L5-S1. No hardware bony complicating features. IMPRESSION: Posterior fusion L5-S1. Electronically Signed   By: Rolm Baptise M.D.   On: 05/26/2020 15:09   DG C-Arm 1-60 Min  Result Date: 05/26/2020 CLINICAL DATA:  L5-S1 fusion EXAM: LUMBAR SPINE - 2-3 VIEW; DG C-ARM 1-60 MIN COMPARISON:  MRI 02/15/2020 FINDINGS: Posterior fusion changes noted at L5-S1. No hardware bony complicating features. IMPRESSION: Posterior fusion L5-S1. Electronically Signed   By: Rolm Baptise M.D.   On: 05/26/2020 15:09    Assessment/Plan: Postop day 1 lumbar fusion. Patient not ready to be discharged today. Will transfer to floor. Continue therapies and  pain management   LOS: 1 day    Christy Long 05/27/2020, 8:45 AM

## 2020-05-27 NOTE — Evaluation (Signed)
Physical Therapy Evaluation Patient Details Name: Christy Long MRN: [id number] DOB: [birth year] Today's Date: 05/27/2020   History of Present Illness  Pt is a 64 y/o female s/p PLIF L5-S1. PMH including but not limited to DM, HTN and fibromyalgia.  Clinical Impression  Pt presented supine in bed with HOB elevated, awake and willing to participate in therapy session. Prior to admission, pt reported that she was independent with ADLs and used a cane PRN to ambulate. Pt lives with her daughter in a single level home with a ramped entrance. Pt stating that her daughter is unable to assist her as she has "disabilities" and uses a w/c. Pt reporting that she has a sister and sons that will be able to assist her intermittently. At the time of evaluation, pt significantly limited overall with mobility secondary to pain. She was able to perform bed mobility with supervision, transfers with min guard and ambulate a short distance in hallway with RW and min guard. Based on pt's current functional mobility status and amount of assistance she will have at home, would recommend f/u therapy services via HHPT. Pt agreeable. PT provided pt education re: back precautions with handout provided, car transfers and a generalized walking program for pt to initiate upon d/c home. Pt would continue to benefit from skilled physical therapy services at this time while admitted and after d/c to address the below listed limitations in order to improve overall safety and independence with functional mobility.     Follow Up Recommendations Home health PT    Equipment Recommendations  Rolling walker with 5" wheels;3in1 (PT)    Recommendations for Other Services       Precautions / Restrictions Precautions Precautions: Back;Fall Precaution Booklet Issued: Yes (comment) Precaution Comments: reviewed handout with pt throughout Required Braces or Orthoses: Spinal Brace Spinal Brace: Lumbar corset;Applied in sitting  position Restrictions Weight Bearing Restrictions: No      Mobility  Bed Mobility Overal bed mobility: Needs Assistance Bed Mobility: Rolling;Sidelying to Sit Rolling: Supervision Sidelying to sit: Supervision       General bed mobility comments: greatly increased time and effort needed, use of bed rail, cueing for log roll technique  Transfers Overall transfer level: Needs assistance Equipment used: Rolling walker (2 wheeled) Transfers: Sit to/from Stand Sit to Stand: Min guard         General transfer comment: cueing for safe hand placement, min guard for stability with transition  Ambulation/Gait Ambulation/Gait assistance: Min guard Gait Distance (Feet): 75 Feet Assistive device: Rolling walker (2 wheeled) Gait Pattern/deviations: Step-through pattern;Decreased step length - right;Decreased step length - left;Decreased stride length Gait velocity: significantly decreased   General Gait Details: pt with very slow, cautious and guarded gait; unable to increase gait speed with cueing; pt reporting back spasms throughout  Stairs            Wheelchair Mobility    Modified Rankin (Stroke Patients Only)       Balance Overall balance assessment: Needs assistance Sitting-balance support: Feet supported Sitting balance-Leahy Scale: Fair     Standing balance support: During functional activity;Single extremity supported;Bilateral upper extremity supported Standing balance-Leahy Scale: Poor                               Pertinent Vitals/Pain Pain Assessment: Faces Faces Pain Scale: Hurts even more Pain Location: back  Pain Descriptors / Indicators: Spasm Pain Intervention(s): Monitored during session;Repositioned    Home Living  Family/patient expects to be discharged to:: Private residence Living Arrangements: Children Available Help at Discharge: Family;Available PRN/intermittently Type of Home: House Home Access: Ramped entrance      Home Layout: One level Home Equipment: Shower seat;Cane - single point      Prior Function Level of Independence: Independent with assistive device(s)         Comments: ambulated with use of a cane     Hand Dominance        Extremity/Trunk Assessment   Upper Extremity Assessment Upper Extremity Assessment: Defer to OT evaluation;Overall [facility] for tasks assessed    Lower Extremity Assessment Lower Extremity Assessment: Generalized weakness    Cervical / Trunk Assessment Cervical / Trunk Assessment: Other exceptions Cervical / Trunk Exceptions: s/p lumbar sx  Communication   Communication: No difficulties  Cognition Arousal/Alertness: Awake/alert Behavior During Therapy: Flat affect Overall Cognitive Status: Within Functional Limits for tasks assessed                                        General Comments      Exercises     Assessment/Plan    PT Assessment Patient needs continued PT services  PT Problem List Decreased strength;Decreased range of motion;Decreased activity tolerance;Decreased balance;Decreased mobility;Decreased coordination;Decreased knowledge of use of DME;Decreased safety awareness;Decreased knowledge of precautions;Pain       PT Treatment Interventions DME instruction;Gait training;Stair training;Functional mobility training;Therapeutic activities;Therapeutic exercise;Neuromuscular re-education;Balance training;Patient/family education    PT Goals (Current goals can be found in the Care Plan section)  Acute Rehab PT Goals Patient Stated Goal: "go home tomorrow" PT Goal Formulation: With patient Time For Goal Achievement: 06/10/20 Potential to Achieve Goals: Good    Frequency Min 5X/week   Barriers to discharge Decreased caregiver support      Co-evaluation               AM-PAC PT "6 Clicks" Mobility  Outcome Measure Help needed turning from your back to your side while in a flat bed without using bedrails?:  None Help needed moving from lying on your back to sitting on the side of a flat bed without using bedrails?: None Help needed moving to and from a bed to a chair (including a wheelchair)?: None Help needed standing up from a chair using your arms (e.g., wheelchair or bedside chair)?: None Help needed to walk in hospital room?: A Little Help needed climbing 3-5 steps with a railing? : A Lot 6 Click Score: 21    End of Session Equipment Utilized During Treatment: Back brace;Gait belt Activity Tolerance: Patient limited by pain Patient left: in chair;with call bell/phone within reach Nurse Communication: Mobility status PT Visit Diagnosis: Other abnormalities of gait and mobility (R26.89);Pain Pain - part of body: (back)    Time: 9937-1696 PT Time Calculation (min) (ACUTE ONLY): 27 min   Charges:   PT Evaluation $PT Eval Moderate Complexity: 1 Mod PT Treatments $Gait Training: 8-22 mins        Anastasio Champion, DPT  Acute Rehabilitation Services Pager [phone] Office [place] 05/27/2020, 8:54 AM

## 2020-05-27 NOTE — Progress Notes (Signed)
Patient is transfered from room 3C02 to unit 3W29 at this time. Alert and in stable condition. Report given to receiving nurse Loistine Simas, RN with all questions answered. Left unit via wheelchair with all belongings at side.

## 2020-05-28 LAB — GLUCOSE, CAPILLARY
Glucose-Capillary: 158 mg/dL — ABNORMAL HIGH (ref 70–99)
Glucose-Capillary: 109 mg/dL — ABNORMAL HIGH (ref 70–99)
Glucose-Capillary: 240 mg/dL — ABNORMAL HIGH (ref 70–99)
Glucose-Capillary: 178 mg/dL — ABNORMAL HIGH (ref 70–99)

## 2020-05-28 MED ORDER — HYDROMORPHONE HCL 1 MG/ML IJ SOLN
1.0000 mg | INTRAMUSCULAR | Status: DC | PRN
  Administered 2020-05-28 – 2020-05-30 (×8): 1 mg via INTRAVENOUS
  Filled 2020-05-28 (×8): qty 1

## 2020-05-28 MED ORDER — DEXAMETHASONE SODIUM PHOSPHATE 4 MG/ML IJ SOLN
4.0000 mg | Freq: Two times a day (BID) | INTRAMUSCULAR | Status: DC
  Administered 2020-05-28 – 2020-05-30 (×5): 4 mg via INTRAVENOUS
  Filled 2020-05-28 (×5): qty 1

## 2020-05-28 MED ORDER — FAMOTIDINE 20 MG PO TABS
20.0000 mg | ORAL_TABLET | Freq: Every day | ORAL | Status: DC
  Administered 2020-05-28 – 2020-05-30 (×3): 20 mg via ORAL
  Filled 2020-05-28 (×3): qty 1

## 2020-05-28 MED ORDER — HEPARIN SODIUM (PORCINE) 5000 UNIT/ML IJ SOLN
5000.0000 [IU] | Freq: Three times a day (TID) | INTRAMUSCULAR | Status: DC
  Administered 2020-05-28 – 2020-05-30 (×5): 5000 [IU] via SUBCUTANEOUS
  Filled 2020-05-28 (×5): qty 1

## 2020-05-28 NOTE — Progress Notes (Signed)
Overnight, patient walked the hallway twice using front wheel walker and wearing back brace. Patient was up to the bathroom and voiding. Pain controlled with PRN meds.

## 2020-05-28 NOTE — Progress Notes (Signed)
Ambulated from bed to bathroom wearing brace and using the walker; patient returned to sit in the chair.

## 2020-05-28 NOTE — Progress Notes (Signed)
Neurosurgery Service Progress Note  Subjective: No acute events overnight, having severe R hip pain   Objective: Vitals:   05/27/20 2329 05/28/20 0111 05/28/20 0443 05/28/20 0739  BP: 132/75 121/80 (!) 154/82 137/74  Pulse: 68 76 74 75  Resp: 18  20 14   Temp: 98.3 F (36.8 C)  97.9 F (36.6 C) 98.8 F (37.1 C)  TempSrc: Oral  Oral Oral  SpO2: 95%  96% 97%  Weight:      Height:       Temp (24hrs), Avg:98.2 F (36.8 C), Min:97.9 F (36.6 C), Max:98.8 F (37.1 C)  CBC Latest Ref Rng & Units 05/27/2020 05/24/2020 02/21/2020  WBC 4.0 - 10.5 K/uL 15.7(H) 9.5 12.7(H)  Hemoglobin 12.0 - 15.0 g/dL 9.3(L) 11.2(L) 10.9(L)  Hematocrit 36.0 - 46.0 % 29.8(L) 36.4 34.3(L)  Platelets 150 - 400 K/uL 234 265 278.0   BMP Latest Ref Rng & Units 05/27/2020 05/24/2020 02/21/2020  Glucose 70 - 99 mg/dL 197(H) 150(H) 172(H)  BUN 8 - 23 mg/dL 22 16 19   Creatinine 0.44 - 1.00 mg/dL 1.03(H) 0.92 0.88  Sodium 135 - 145 mmol/L 136 139 139  Potassium 3.5 - 5.1 mmol/L 4.5 4.0 4.1  Chloride 98 - 111 mmol/L 102 101 102  CO2 22 - 32 mmol/L 26 28 29   Calcium 8.9 - 10.3 mg/dL 8.6(L) 9.8 9.6    Intake/Output Summary (Last 24 hours) at 05/28/2020 0950 Last data filed at 05/27/2020 1611 Gross per 24 hour  Intake 240 ml  Output --  Net 240 ml    Current Facility-Administered Medications:  .  acetaminophen (TYLENOL) tablet 650 mg, 650 mg, Oral, Q4H PRN, 650 mg at 05/27/20 0143 **OR** acetaminophen (TYLENOL) suppository 650 mg, 650 mg, Rectal, Q4H PRN, Costella, Vincent J, PA-C .  albuterol (PROVENTIL) (2.5 MG/3ML) 0.083% nebulizer solution 3 mL, 3 mL, Inhalation, Q6H PRN, Costella, Vincent J, PA-C .  allopurinol (ZYLOPRIM) tablet 300 mg, 300 mg, Oral, Daily, Consuella Lose, MD, 300 mg at 05/27/20 0943 .  ALPRAZolam Duanne Moron) tablet 0.5 mg, 0.5 mg, Oral, PRN, Costella, Vincent J, PA-C, 0.5 mg at 05/26/20 1816 .  bisacodyl (DULCOLAX) suppository 10 mg, 10 mg, Rectal, Daily PRN, Costella, Vincent J, PA-C .  busPIRone  (BUSPAR) tablet 10 mg, 10 mg, Oral, BID, Consuella Lose, MD, 10 mg at 05/27/20 2131 .  cloNIDine (CATAPRES) tablet 0.1 mg, 0.1 mg, Oral, TID, Costella, Vincent J, PA-C, 0.1 mg at 05/27/20 2130 .  docusate sodium (COLACE) capsule 100 mg, 100 mg, Oral, BID, Costella, Vincent J, PA-C, 100 mg at 05/27/20 2130 .  glipiZIDE (GLUCOTROL XL) 24 hr tablet 2.5 mg, 2.5 mg, Oral, BID, Costella, Vincent J, PA-C, 2.5 mg at 05/27/20 2131 .  HYDROcodone-acetaminophen (NORCO/VICODIN) 5-325 MG per tablet 1 tablet, 1 tablet, Oral, Q4H PRN, Costella, Vincent J, PA-C .  insulin aspart (novoLOG) injection 0-15 Units, 0-15 Units, Subcutaneous, TID WC, Consuella Lose, MD, 3 Units at 05/28/20 0825 .  insulin aspart (novoLOG) injection 0-5 Units, 0-5 Units, Subcutaneous, QHS, Nundkumar, Neelesh, MD .  labetalol (NORMODYNE) tablet 100 mg, 100 mg, Oral, BID, Consuella Lose, MD, 100 mg at 05/27/20 2130 .  lactated ringers infusion, , Intravenous, Continuous, Costella, Vincent Lenna Sciara, PA-C, Last Rate: 10 mL/hr at 05/26/20 0835, New Bag at 05/26/20 1138 .  levothyroxine (SYNTHROID) tablet 100 mcg, 100 mcg, Oral, Q0600, Costella, Vista Mink, PA-C, 100 mcg at 05/28/20 0509 .  loratadine (CLARITIN) tablet 10 mg, 10 mg, Oral, QPM, Consuella Lose, MD, 10 mg at 05/26/20 1647 .  losartan (COZAAR) tablet 50 mg, 50 mg, Oral, BID, Consuella Lose, MD, 50 mg at 05/27/20 2130 .  magnesium citrate solution 1 Bottle, 1 Bottle, Oral, BID PRN, Consuella Lose, MD .  menthol-cetylpyridinium (CEPACOL) lozenge 3 mg, 1 lozenge, Oral, PRN **OR** phenol (CHLORASEPTIC) mouth spray 1 spray, 1 spray, Mouth/Throat, PRN, Costella, Vincent J, PA-C .  metFORMIN (GLUCOPHAGE-XR) 24 hr tablet 500 mg, 500 mg, Oral, BID WC, Costella, Vincent J, PA-C, 500 mg at 05/28/20 0824 .  morphine 2 MG/ML injection 1 mg, 1 mg, Intravenous, Q4H PRN, Meyran, Ocie Cornfield, NP, 1 mg at 05/28/20 0824 .  ondansetron (ZOFRAN) tablet 4 mg, 4 mg, Oral, Q6H PRN **OR**  ondansetron (ZOFRAN) injection 4 mg, 4 mg, Intravenous, Q6H PRN, Costella, Vincent J, PA-C, 4 mg at 05/26/20 1646 .  oxyCODONE-acetaminophen (PERCOCET/ROXICET) 5-325 MG per tablet 1-2 tablet, 1-2 tablet, Oral, Q4H PRN, Consuella Lose, MD, 2 tablet at 05/28/20 0509 .  pantoprazole (PROTONIX) EC tablet 80 mg, 80 mg, Oral, Q1200, Costella, Vincent Lenna Sciara, PA-C, 80 mg at 05/27/20 1206 .  scopolamine (TRANSDERM-SCOP) 1 MG/3DAYS 1.5 mg, 1 patch, Transdermal, Q72H, Costella, Vincent J, PA-C, 1.5 mg at 05/26/20 6433 .  senna (SENOKOT) tablet 8.6 mg, 1 tablet, Oral, BID, Costella, Vincent J, PA-C, 8.6 mg at 05/27/20 2131 .  senna-docusate (Senokot-S) tablet 1 tablet, 1 tablet, Oral, QHS PRN, Costella, Vincent J, PA-C .  sodium chloride flush (NS) 0.9 % injection 3 mL, 3 mL, Intravenous, Q12H, Costella, Vincent J, PA-C, 3 mL at 05/27/20 2131 .  sodium chloride flush (NS) 0.9 % injection 3 mL, 3 mL, Intravenous, PRN, Costella, Vincent J, PA-C .  sodium phosphate (FLEET) 7-19 GM/118ML enema 1 enema, 1 enema, Rectal, Once PRN, Costella, Vincent J, PA-C .  tiZANidine (ZANAFLEX) tablet 4 mg, 4 mg, Oral, Q6H PRN, Consuella Lose, MD, 4 mg at 05/28/20 0825 .  traZODone (DESYREL) tablet 100 mg, 100 mg, Oral, QHS, Costella, Vista Mink, PA-C, 100 mg at 05/27/20 2132   Physical Exam: Strength 5/5 in BUE and LLE Strength pain limited in the R proximal lower extremity, distally 5/5 SILTx4 +point tenderness over R greater trochanter, severe pain with internal / external rotation of R hip, no numbness in R anterolateral thigh  Assessment & Plan: 64 y.o. woman s/p 5-1 PLIF, post op her radicular symptoms are resolved but she's having severe R hip pain. Exam inconsistent with meralgia paresthetica, radiculopathy, pain is mainly R anterolateral thigh, not consistent with the back-pain-predominant presentation of a post-op hematoma and her radicular symptoms are significantly better.   -will start steroids at a low dose,  can increase tomorrow if tolerated.  Pt says she had prednisone previously and was miserable with palpatations -already on sliding scale w/ CBGs, will add H2 blocker -SCDs/TEDs, SQH  Judith Part  05/28/20 9:50 AM

## 2020-05-28 NOTE — Progress Notes (Signed)
Patient ambulated in the hallway to the nurse's station; wearing brace and using the walker. Returned to bedside for lunch.

## 2020-05-28 NOTE — Progress Notes (Signed)
05/28/20 1131  PT Visit Information  Last PT Received On 05/28/20  Assistance Needed +1  History of Present Illness Pt is a 64 y/o female s/p PLIF L5-S1. PMH including but not limited to DM, HTN and fibromyalgia.  Subjective Data  Patient Stated Goal to decrease pain in R hip   Precautions  Precautions Back;Fall  Precaution Comments Pt able to recall 3/3 precautions with cues. Able to maintain throughout mobility.   Required Braces or Orthoses Spinal Brace  Spinal Brace Lumbar corset;Applied in sitting position  Restrictions  Weight Bearing Restrictions No  Pain Assessment  Pain Assessment 0-10  Pain Score 8  Pain Location R hip and thigh   Pain Descriptors / Indicators Grimacing;Guarding (reports "deep pain" )  Pain Intervention(s) Monitored during session;Limited activity within patient's tolerance;Repositioned  Cognition  Arousal/Alertness Awake/alert  Behavior During Therapy WFL for tasks assessed/performed  Overall Cognitive Status Within Functional Limits for tasks assessed  Bed Mobility  Overal bed mobility Needs Assistance  Bed Mobility Rolling;Sidelying to Sit;Sit to Sidelying  Rolling Supervision  Sidelying to sit Min assist  Sit to sidelying Min assist  General bed mobility comments Min A for RLE assist during bed mobility secondary to pain.   Transfers  Overall transfer level Needs assistance  Equipment used Rolling walker (2 wheeled)  Transfers Sit to/from Stand  Sit to Stand Min guard  General transfer comment Min guard for safety. Increased time secondary to pain.   Ambulation/Gait  Ambulation/Gait assistance Min guard  Gait Distance (Feet) 35 Feet  Assistive device Rolling walker (2 wheeled)  Gait Pattern/deviations Step-to pattern;Decreased step length - right;Decreased step length - left;Decreased weight shift to right;Antalgic  General Gait Details Very slow, antalgic gait. Decreased weightshift to RLE secondary to pain. Gait limited secondary to pain.    Gait velocity Decreased   Balance  Overall balance assessment Needs assistance  Sitting-balance support Feet supported  Sitting balance-Leahy Scale Fair  Standing balance support During functional activity;Single extremity supported;Bilateral upper extremity supported  Standing balance-Leahy Scale Poor  Standing balance comment Reliant on UE support   PT - End of Session  Equipment Utilized During Treatment Back brace  Activity Tolerance Patient limited by pain  Patient left in bed;with call bell/phone within reach  Nurse Communication Mobility status   PT - Assessment/Plan  PT Plan Current plan remains appropriate  PT Visit Diagnosis Other abnormalities of gait and mobility (R26.89);Pain  Pain - part of body  (back)  PT Frequency (ACUTE ONLY) Min 5X/week  Follow Up Recommendations Home health PT;Supervision for mobility/OOB  PT equipment Rolling walker with 5" wheels;3in1 (PT)  AM-PAC PT "6 Clicks" Mobility Outcome Measure (Version 2)  Help needed turning from your back to your side while in a flat bed without using bedrails? 4  Help needed moving from lying on your back to sitting on the side of a flat bed without using bedrails? 3  Help needed moving to and from a bed to a chair (including a wheelchair)? 3  Help needed standing up from a chair using your arms (e.g., wheelchair or bedside chair)? 3  Help needed to walk in hospital room? 3  Help needed climbing 3-5 steps with a railing?  2  6 Click Score 18  Consider Recommendation of Discharge To: Home with [facility]  PT Goal Progression  Progress towards PT goals Progressing toward goals  Acute Rehab PT Goals  PT Goal Formulation With patient  Time For Goal Achievement 06/10/20  Potential to Achieve Goals  Good  PT Time Calculation  PT Start Time (ACUTE ONLY) 0944  PT Stop Time (ACUTE ONLY) 1004  PT Time Calculation (min) (ACUTE ONLY) 20 min  PT General Charges  $$ ACUTE PT VISIT 1 Visit  PT Treatments  $Gait Training 8-22  mins   Pt progressing slowly towards goals. Increased pain in R hip reported which limited ambulation tolerance. Pt requiring min guard A for short distance ambulation within room. Anticipate pt will progress well once pain improved. Motivated to work with therapies. Will continue to follow acutely.   Reuel Derby, PT, DPT  Acute Rehabilitation Services  Pager: [phone] Office: [phone]

## 2020-05-28 NOTE — Progress Notes (Signed)
Occupational Therapy Treatment Patient Details Name: Christy Long MRN: [id number] DOB: Apr 29, 1956 Today's Date: 05/28/2020    History of present illness Pt is a 64 y/o female s/p PLIF L5-S1. PMH including but not limited to DM, HTN and fibromyalgia.   OT comments  Pt. Seen for skilled OT treatment session.  Session limited by pt. C/o feeling "woozy" and also pain.  Min guard a for in room ambulation from b.room to recliner.   Education and demonstration of tub transfers.  Pt. Reports she does not feel able to clear edge of high tub and will sponge bathe until shower stall installed mid June.  Expressed interest in A/E and plans to purchase for use with LB ADLs.  Will plan for LB ADL with A/E next session.     Follow Up Recommendations  Home health OT;Supervision - Intermittent    Equipment Recommendations  3 in 1 bedside commode;Tub/shower bench    Recommendations for Other Services      Precautions / Restrictions Precautions Precautions: Back;Fall Precaution Comments: Pt able to recall 3/3 precautions with cues. Able to maintain throughout mobility.  Required Braces or Orthoses: Spinal Brace Spinal Brace: Lumbar corset;Applied in sitting position Restrictions Weight Bearing Restrictions: No       Mobility Bed Mobility               General bed mobility comments: in b.room upon arrival  Transfers Overall transfer level: Needs assistance Equipment used: Rolling walker (2 wheeled) Transfers: Sit to/from [facility] Sit to Stand: Min guard Stand pivot transfers: Min guard       General transfer comment: cueing for safe hand placement, min guard for stability with transition    Balance                                           ADL either performed or assessed with clinical judgement   ADL Overall ADL's : Needs assistance/impaired                                   Tub/Shower Transfer Details (indicate cue type  and reason): pt. reports high tub with R faucets. reviewed and demonstrated transfer options. pt. with significant pain today. reports "tub was difficult before sx. and now after sx. with the pain im in" and nervously laughed.  reports family is having walk in shower installed mid june. states for now she feels more comfortable starting with sponge bathing then progressing to tub if able or waiting for installation of walk in shower Functional mobility during ADLs: Min guard;Rolling walker;Cueing for safety General ADL Comments: pt. in b.room upon arrival. able to manage her peri care without assistance.  verbalizes wanting to sponge bathe vs. attempt tub transfer.  states she is interesed in A/E and plans to purchase and wants to review use but is limited this day to "feeling woozy" and also sig. pain.  reviewd we will practice this another day. pt. agreed     Vision       Perception     Praxis      Cognition   Behavior During Therapy: [facility] for tasks assessed/performed Overall Cognitive Status: Within Functional Limits for tasks assessed  Exercises     Shoulder Instructions       General Comments      Pertinent Vitals/ Pain       Pain Assessment: Faces Faces Pain Scale: Hurts whole lot Pain Location: back area Pain Descriptors / Indicators: Spasm;Grimacing;Guarding Pain Intervention(s): Premedicated before session;Monitored during session;Limited activity within patient's tolerance;Repositioned  Home Living                                          Prior Functioning/Environment              Frequency  Min 2X/week        Progress Toward Goals  OT Goals(current goals can now be found in the care plan section)  Progress towards OT goals: Progressing toward goals  Acute Rehab OT Goals Patient Stated Goal: to decrease pain in R hip   Plan      Co-evaluation                 AM-PAC  OT "6 Clicks" Daily Activity     Outcome Measure   Help from another person eating meals?: None Help from another person taking care of personal grooming?: A Little Help from another person toileting, which includes using toliet, bedpan, or urinal?: A Little Help from another person bathing (including washing, rinsing, drying)?: A Little Help from another person to put on and taking off regular upper body clothing?: A Little Help from another person to put on and taking off regular lower body clothing?: A Little 6 Click Score: 19    End of Session Equipment Utilized During Treatment: Back brace  OT Visit Diagnosis: Unsteadiness on feet (R26.81);Muscle weakness (generalized) (M62.81);Pain Pain - Right/Left: Right Pain - part of body: Leg   Activity Tolerance Patient tolerated treatment well;Patient limited by pain   Patient Left with call bell/phone within reach;in chair   Nurse Communication Other (comment)(reviewed with rn pt. positioning in recliner for pain relief. (partially elevated with trashcan to support))        Time: 1141-1150 OT Time Calculation (min): 9 min  Charges: OT General Charges $OT Visit: 1 Visit OT Treatments $Self Care/Home Management : 8-22 mins  Sonia Baller, COTA/L Acute Rehabilitation [phone]   Janice Coffin 05/28/2020, 12:00 PM

## 2020-05-28 NOTE — Progress Notes (Signed)
Patient reporting pain on scale 1-10 a 10.   Working with prn medications will inform MD.;  Patient reports continued discomfort in right upper leg and back; spasms and pain post operative.  Continues to mobilize just very uncomfortable; maintaining back precautions well.

## 2020-05-29 LAB — GLUCOSE, CAPILLARY
Glucose-Capillary: 117 mg/dL — ABNORMAL HIGH (ref 70–99)
Glucose-Capillary: 162 mg/dL — ABNORMAL HIGH (ref 70–99)
Glucose-Capillary: 165 mg/dL — ABNORMAL HIGH (ref 70–99)
Glucose-Capillary: 183 mg/dL — ABNORMAL HIGH (ref 70–99)
Glucose-Capillary: 204 mg/dL — ABNORMAL HIGH (ref 70–99)
Glucose-Capillary: 215 mg/dL — ABNORMAL HIGH (ref 70–99)

## 2020-05-29 NOTE — Progress Notes (Signed)
Occupational Therapy Treatment Patient Details Name: Christy Long MRN: [id number] DOB: [birth year] Today's Date: 05/29/2020    History of present illness Pt is a 64 y/o female s/p PLIF L5-S1. PMH including but not limited to DM, HTN and fibromyalgia.   OT comments  Pt making steady progress towards OT goals this session. Pt continues to be limited by pain, decreased ability to care for self, and generalized weakness impacting pts ability to complete BADLs. Session focus on education related to LB AE and LB ADLs. Overall, pt requires MIN A for LB ADLs with AE with pt able to don socks with sock aid with MIN A needing cues for novel task. Pt declined OOB mobility d/t pain but very receptive to education related to LB AE and compensatory methods related to ADLs. Agree with DC plan below, will follow acutely per POC.    Follow Up Recommendations  Home health OT;Supervision - Intermittent    Equipment Recommendations  3 in 1 bedside commode;Tub/shower bench    Recommendations for Other Services      Precautions / Restrictions Precautions Precautions: Back;Fall Precaution Booklet Issued: Yes (comment) Precaution Comments: Pt able to recall 3/3 precautions Required Braces or Orthoses: Spinal Brace Spinal Brace: Lumbar corset;Applied in sitting position Restrictions Weight Bearing Restrictions: No       Mobility Bed Mobility Overal bed mobility: Needs Assistance Bed Mobility: Rolling;Sidelying to Sit;Sit to Sidelying Rolling: Supervision Sidelying to sit: Supervision     Sit to sidelying: Min guard General bed mobility comments: pt with good carryover of log roll technique; supervision for sidelying>sit; light mIN guard to assist with returning BLEs to sidelying. increased effort noted. pt reports high bed at home, education on using step to get into bed  Transfers Overall transfer level: Needs assistance Equipment used: Rolling walker (2 wheeled) Transfers: Sit to/from  Stand Sit to Stand: Min guard         General transfer comment: pt declined OOB mobility    Balance Overall balance assessment: Needs assistance Sitting-balance support: Feet supported Sitting balance-Leahy Scale: Good Sitting balance - Comments: able to complete dynamic tasks EOB with no LOB   Standing balance support: During functional activity;Single extremity supported;Bilateral upper extremity supported Standing balance-Leahy Scale: Poor Standing balance comment: Reliant on UE support                            ADL either performed or assessed with clinical judgement   ADL Overall ADL's : Needs assistance/impaired       Grooming Details (indicate cue type and reason): education provided related to maintaining back precautions during grooming tasks       Lower Body Bathing Details (indicate cue type and reason): education on using LH sponge for LB bathing; although pt reports likely completing sponge baths initially Upper Body Dressing : Minimal assistance;Sitting Upper Body Dressing Details (indicate cue type and reason): minA to donn brace Lower Body Dressing: Minimal assistance;Sitting/lateral leans;Cueing for sequencing;Adhering to back precautions;Cueing for compensatory techniques Lower Body Dressing Details (indicate cue type and reason): pt able to don socks with MIN A using sock aid; cues to sequence novel task. education on using reacher for LB dressing with pt verbalizing understanding       Toileting - Clothing Manipulation Details (indicate cue type and reason): education on using toilet aid or LH sponge with towel for pericare with pt verbalizing understanding       General ADL Comments: session focus on  LB AE education; issued handout of items; education on using aerobic step to get into bed as pt reports high bed     Vision       Perception     Praxis      Cognition Arousal/Alertness: Awake/alert Behavior During Therapy: WFL for  tasks assessed/performed Overall Cognitive Status: Within Functional Limits for tasks assessed                                          Exercises     Shoulder Instructions       General Comments issued pt LB AE handout as pt reports wanting to purchase items    Pertinent Vitals/ Pain       Pain Assessment: Faces Faces Pain Scale: Hurts even more Pain Location: back Pain Descriptors / Indicators: Spasm;Grimacing;Guarding Pain Intervention(s): Limited activity within patient's tolerance;Monitored during session;Repositioned  Home Living                                          Prior Functioning/Environment              Frequency  Min 2X/week        Progress Toward Goals  OT Goals(current goals can now be found in the care plan section)  Progress towards OT goals: Progressing toward goals  Acute Rehab OT Goals Patient Stated Goal: to decrease pain in R hip  OT Goal Formulation: With patient Time For Goal Achievement: 06/10/20 Potential to Achieve Goals: Good  Plan Discharge plan remains appropriate    Co-evaluation                 AM-PAC OT "6 Clicks" Daily Activity     Outcome Measure   Help from another person eating meals?: None Help from another person taking care of personal grooming?: A Little Help from another person toileting, which includes using toliet, bedpan, or urinal?: A Little Help from another person bathing (including washing, rinsing, drying)?: A Little Help from another person to put on and taking off regular upper body clothing?: A Little Help from another person to put on and taking off regular lower body clothing?: A Little 6 Click Score: 19    End of Session Equipment Utilized During Treatment: Back brace  OT Visit Diagnosis: Unsteadiness on feet (R26.81);Muscle weakness (generalized) (M62.81);Pain Pain - Right/Left: Right Pain - part of body: Leg   Activity Tolerance Patient tolerated  treatment well;Patient limited by pain   Patient Left in bed;with call bell/phone within reach   Nurse Communication          Time: 0174-9449 OT Time Calculation (min): 19 min  Charges: OT General Charges $OT Visit: 1 Visit OT Treatments $Self Care/Home Management : 8-22 mins  Lanier Clam., COTA/L Acute Rehabilitation Services [phone] [place] 05/29/2020, 4:30 PM

## 2020-05-29 NOTE — Progress Notes (Signed)
Overnight, patient voided. RN administered PRN pain meds for c/o severe pain. Up to bathroom X 3 using front wheel walker.

## 2020-05-29 NOTE — Progress Notes (Signed)
  NEUROSURGERY PROGRESS NOTE   No issues overnight.  Complains of appropriate back soreness. Pre op LLE pain resolved. Has right hip pain that radiates laterally to knee. Reports this started shortly before surgery. Pain causes difficulties with ambulation.  EXAM:  BP (!) 171/86 (BP Location: Left Arm)   Pulse (!) 53   Temp 97.7 F (36.5 C) (Oral)   Resp 18   Ht 5\' 5"  (1.651 m)   Wt 100.7 kg   SpO2 97%   BMI 36.94 kg/m   Awake, alert, oriented  Speech fluent, appropriate  CN grossly intact  MAEW, pain mediated weakness proximal BLE Right hip: exquisite tenderness to greater trochanter Incision: c/d/i  IMPRESSION/PLAN 64 y.o. female POD3 L5-S1 PLIF with resolution of pre operative LLE pain. She developed RLE pain shortly before surgery which has not improved. I have reviewed her MRI L spine from pre op and there is again no significant right sided disease, suspect hip pathology. Continue steroids. Up with therapy today. Anticipate d/c tomorrow.

## 2020-05-29 NOTE — TOC Initial Note (Signed)
Transition of Care [facility]) - Initial/Assessment Note    Patient Details  Name: Christy Long MRN: [id number] Date of Birth: 1956/01/04  [facility]) CM/SW Contact:    Pollie Friar, RN Phone Number: 05/29/2020, 10:51 AM  Clinical Narrative:                 Pt states she lives at home with her disabled daughter. She states she doesn't have to physically assist her but she does the cooking.  She states her 2 sons work but can check in on them. Her sister has health issues but can check in on them also. She also states her 82 yr old granddaughter can stay with her most likely.  Walker ordered through [place] and they will deliver to the room. Pt has no choice on [facility] agencies. CM following and will arrange prior to d/c home. Pt states she will have transport home when medically ready.   Expected Discharge Plan: [place] Barriers to Discharge: Continued Medical Work up   Patient Goals and CMS Choice   CMS Medicare.gov Compare Post Acute Care list provided to:: Patient Choice offered to / list presented to : Patient  Expected Discharge Plan and Services Expected Discharge Plan: [place]   Discharge Planning Services: CM Consult Post Acute Care Choice: Home Health, Durable Medical Equipment Living arrangements for the past 2 months: Single Family Home                 DME Arranged: Walker rolling DME Agency: [employer] Date DME Agency Contacted: 05/29/20   Representative spoke with at DME Agency: [place]            Prior Living Arrangements/Services Living arrangements for the past 2 months: [place] Lives with:: Adult Children Patient language and need for interpreter reviewed:: Yes Do you feel safe going back to the place where you live?: Yes      Need for Family Participation in Patient Care: Yes (Comment) Care giver support system in place?: No (comment)(she lives with disabled daughter, has a sister and 2 sons  that can check in on her) Current home services: DME(3 in 1 and shower seat) Criminal Activity/Legal Involvement Pertinent to Current Situation/Hospitalization: No - Comment as needed  Activities of Daily Living Home Assistive Devices/Equipment: Eyeglasses, CBG Meter, Blood pressure cuff, Scales, Hand-held shower hose ADL Screening (condition at time of admission) Patient's cognitive ability adequate to safely complete daily activities?: Yes Is the patient deaf or have difficulty hearing?: No Does the patient have difficulty seeing, even when wearing glasses/contacts?: No Does the patient have difficulty concentrating, remembering, or making decisions?: No Patient able to express need for assistance with ADLs?: Yes Does the patient have difficulty dressing or bathing?: Yes Independently performs ADLs?: No Communication: Independent Dressing (OT): Needs assistance Is this a change from baseline?: Change from baseline, expected to last <3days Grooming: Independent Feeding: Independent Bathing: Needs assistance Is this a change from baseline?: Change from baseline, expected to last <3 days Toileting: Needs assistance Is this a change from baseline?: Change from baseline, expected to last <3 days In/Out Bed: Needs assistance Is this a change from baseline?: Change from baseline, expected to last <3 days Does the patient have difficulty walking or climbing stairs?: Yes Weakness of Legs: Both Weakness of Arms/Hands: None  Permission Sought/Granted                  Emotional Assessment Appearance:: Appears stated  age Attitude/Demeanor/Rapport: Engaged Affect (typically observed): Accepting Orientation: : Oriented to Self, Oriented to Place, Oriented to  Time, Oriented to Situation   Psych Involvement: No (comment)  Admission diagnosis:  Lumbar radiculopathy [M54.16] Spondylosis [M47.9] Patient Active Problem List   Diagnosis Date Noted  . Lumbar radiculopathy 05/26/2020  .  Spondylosis 05/26/2020   PCP:  Zoila Shutter, NP Pharmacy:   [facility] [address] [place] [address] Phone: [phone] Fax: [phone]     Social Determinants of Health (SDOH) Interventions    Readmission Risk Interventions No flowsheet data found.

## 2020-05-29 NOTE — Progress Notes (Signed)
Physical Therapy Treatment Patient Details Name: Christy Long MRN: [id number] DOB: 1956-07-25 Today's Date: 05/29/2020    History of Present Illness Pt is a 64 y/o female s/p PLIF L5-S1. PMH including but not limited to DM, HTN and fibromyalgia.    PT Comments    Pt in bed upon arrival of PT, agreeable to session with focus on progressing functional endurance and stability. The pt was able to demo good bed mobility with only minimal cues given for spinal precautions with mobility, however, the pt continues to use bed rails and elevated HOB which she does not have access to at home. The pt was then able to demo improvements in ambulation distance with use of RW and minG for safety, but demos significant limitations in endurance in RLE and antalgic gait due to pain which cause decreased safety and stability with distances >40 ft. The pt will continue to benefit from skilled PT to further progress functional strength in BLE as well as endurance and activity tolerance to improve safety and capacity for self care following d/c.    Follow Up Recommendations  Home health PT;Supervision for mobility/OOB     Equipment Recommendations  Rolling walker with 5" wheels;3in1 (PT)    Recommendations for Other Services       Precautions / Restrictions Precautions Precautions: Back;Fall Precaution Booklet Issued: Yes (comment)(pt reported she lost original, given another handout) Precaution Comments: Pt able to recall 3/3 precautions with cues. Able to maintain throughout mobility.  Required Braces or Orthoses: Spinal Brace Spinal Brace: Lumbar corset;Applied in sitting position Restrictions Weight Bearing Restrictions: No    Mobility  Bed Mobility Overal bed mobility: Needs Assistance Bed Mobility: Rolling;Sidelying to Sit Rolling: Supervision Sidelying to sit: Min guard       General bed mobility comments: minG to rise, but pt with heavy use of bed rails and elevated HOB, discussed needing  to practice without rails/elevation  Transfers Overall transfer level: Needs assistance Equipment used: Rolling walker (2 wheeled) Transfers: Sit to/from Stand Sit to Stand: Min guard         General transfer comment: cueing for safe hand placement, min guard for stability with transition  Ambulation/Gait Ambulation/Gait assistance: Min guard Gait Distance (Feet): 50 Feet Assistive device: Rolling walker (2 wheeled) Gait Pattern/deviations: Step-to pattern;Decreased step length - right;Decreased step length - left;Decreased weight shift to right;Antalgic Gait velocity: 0.11 m/s Gait velocity interpretation: <1.31 ft/sec, indicative of household ambulator General Gait Details: Very slow, antalgic gait. Decreased weightshift to RLE secondary to pain. Gait limited secondary to pain pt frequently stopping to focus on breathing through pain.   Stairs             Wheelchair Mobility    Modified Rankin (Stroke Patients Only)       Balance Overall balance assessment: Needs assistance Sitting-balance support: Feet supported Sitting balance-Leahy Scale: Good     Standing balance support: During functional activity;Single extremity supported;Bilateral upper extremity supported Standing balance-Leahy Scale: Poor Standing balance comment: Reliant on UE support                             Cognition Arousal/Alertness: Awake/alert Behavior During Therapy: WFL for tasks assessed/performed Overall Cognitive Status: Within Functional Limits for tasks assessed  Exercises      General Comments General comments (skin integrity, edema, etc.): pt with significant pain, especially with continued mobility, HR to 90s. sig increased difficulty with RLE mobility at end of ambulation      Pertinent Vitals/Pain Pain Assessment: Faces Faces Pain Scale: Hurts even more Pain Location: back and R hip/thigh Pain  Descriptors / Indicators: Spasm;Grimacing;Guarding(pt reports R thigh feels like "deep bruise") Pain Intervention(s): Limited activity within patient's tolerance;Monitored during session;Repositioned;Patient requesting pain meds-RN notified    Home Living                      Prior Function            PT Goals (current goals can now be found in the care plan section) Acute Rehab PT Goals Patient Stated Goal: to decrease pain in R hip  PT Goal Formulation: With patient Time For Goal Achievement: 06/10/20 Potential to Achieve Goals: Good Progress towards PT goals: Progressing toward goals    Frequency    Min 5X/week      PT Plan Current plan remains appropriate    Co-evaluation              AM-PAC PT "6 Clicks" Mobility   Outcome Measure  Help needed turning from your back to your side while in a flat bed without using bedrails?: None Help needed moving from lying on your back to sitting on the side of a flat bed without using bedrails?: A Little Help needed moving to and from a bed to a chair (including a wheelchair)?: A Little Help needed standing up from a chair using your arms (e.g., wheelchair or bedside chair)?: A Little Help needed to walk in hospital room?: A Little Help needed climbing 3-5 steps with a railing? : A Lot 6 Click Score: 18    End of Session Equipment Utilized During Treatment: Back brace Activity Tolerance: Patient limited by pain Patient left: with call bell/phone within reach;with chair alarm set;in chair Nurse Communication: Mobility status;Patient requests pain meds PT Visit Diagnosis: Other abnormalities of gait and mobility (R26.89);Pain Pain - Right/Left: Right Pain - part of body: Hip(thigh, back)     Time: 2376-2831 PT Time Calculation (min) (ACUTE ONLY): 34 min  Charges:  $Gait Training: 23-37 mins                     Karma Ganja, PT, DPT   Acute Rehabilitation Department Pager #: [phone]   Otho Bellows 05/29/2020, 2:22 PM

## 2020-05-30 LAB — GLUCOSE, CAPILLARY: Glucose-Capillary: 160 mg/dL — ABNORMAL HIGH (ref 70–99)

## 2020-05-30 MED ORDER — METHOCARBAMOL 750 MG PO TABS
750.0000 mg | ORAL_TABLET | Freq: Three times a day (TID) | ORAL | 1 refills | Status: DC | PRN
Start: 2020-05-30 — End: 2021-06-19

## 2020-05-30 MED ORDER — OXYCODONE-ACETAMINOPHEN 10-325 MG PO TABS: 1.0000 | ORAL_TABLET | ORAL | 0 refills | Status: AC | PRN

## 2020-05-30 NOTE — Care Management Important Message (Signed)
Important Message  Patient Details  Name: Christy Long MRN: [id number] Date of Birth: 05-13-1956   Medicare Important Message Given:  Yes     Orbie Pyo 05/30/2020, 12:14 PM

## 2020-05-30 NOTE — Progress Notes (Signed)
  NEUROSURGERY PROGRESS NOTE   No issues overnight. Cont to c/o right hip pain but slowly improving. Ready for d/c  EXAM:  BP (!) 167/87 (BP Location: Left Arm)   Pulse 64   Temp 98.4 F (36.9 C) (Oral)   Resp 16   Ht 5\' 5"  (1.651 m)   Wt 100.7 kg   SpO2 98%   BMI 36.94 kg/m   Awake, alert, oriented  Speech fluent, appropriate  CN grossly intact  5/5 BUE/BLE   IMPRESSION:  64 y.o. female POD# 4 s/p L5-S1 fusion, progressing slowly. Preop radiculopathy improved  PLAN: - D/C home today c HHPT/OT - F/U in office in 3 wks

## 2020-05-30 NOTE — TOC Transition Note (Signed)
Transition of Care [facility]) - CM/SW Discharge Note   Patient Details  Name: Christy Long MRN: [id number] Date of Birth: 1956/06/23  Transition of Care [facility]) CM/SW Contact:  Pollie Friar, RN Phone Number: 05/30/2020, 11:15 AM   Clinical Narrative:    Pt discharging home with [facility] services through [place]. Cory with [facility] aware of d/c home today.  Walker for home at the bedside.  Pt has transportation home.    Final next level of care: Home w Home Health Services Barriers to Discharge: No Barriers Identified   Patient Goals and CMS Choice   CMS Medicare.gov Compare Post Acute Care list provided to:: Patient Choice offered to / list presented to : Patient  Discharge Placement                       Discharge Plan and Services   Discharge Planning Services: CM Consult Post Acute Care Choice: Home Health, Durable Medical Equipment          DME Arranged: Walker rolling DME Agency: [employer] Date DME Agency Contacted: 05/29/20   Representative spoke with at DME Agency: [place]: PT, OT [place] Agency: [place] Date [place]: 05/29/20   Representative spoke with at [place]: [place]) Interventions     Readmission Risk Interventions No flowsheet data found.

## 2020-05-30 NOTE — Progress Notes (Signed)
Patient discharging home. Family here for transportation home. Discharge instructions went over with patient and family, all questions and concerns addressed. [place]

## 2020-05-30 NOTE — Care Management Important Message (Signed)
Important Message  Patient Details  Name: Christy Long MRN: [id number] Date of Birth: December 16, 1956    Patient left prior to discharge.  IM mailed to patient home address.  Medicare Important Message Given:  Yes     Stephan Draughn 05/30/2020, 11:53 AM

## 2020-05-30 NOTE — Discharge Summary (Signed)
Physician Discharge Summary  Patient ID: ARIYONA EID MRN: [id number] DOB/AGE: [birth year] 64 y.o.  Admit date: 05/26/2020 Discharge date: 05/30/2020  Admission Diagnoses:  Lumbar radiculopathy  Discharge Diagnoses:  Same Active Problems:   Lumbar radiculopathy   Spondylosis   Discharged Condition: Stable  Hospital Course:  Christy Long is a 64 y.o. female who was admitted for the below procedure. Post op complicated by right hip pain and thigh pain, likely bursitis. At time of discharge, pain was well controlled, ambulating with Pt/OT, tolerating po, voiding normal. Ready for discharge.  Treatments: Surgery - L5-S1 fusion  Discharge Exam: Blood pressure (!) 176/91, pulse 60, temperature 97.9 F (36.6 C), temperature source Oral, resp. rate 17, height 5\' 5"  (1.651 m), weight 100.7 kg, SpO2 97 %. Awake, alert, oriented Speech fluent, appropriate CN grossly intact MAEW, pain mediated weakness proximal BLE Wound c/d/i  Disposition: Discharge disposition: 01-Home or Self Care       Discharge Instructions    Call MD for:  difficulty breathing, headache or visual disturbances   Complete by: As directed    Call MD for:  persistant dizziness or light-headedness   Complete by: As directed    Call MD for:  redness, tenderness, or signs of infection (pain, swelling, redness, odor or green/yellow discharge around incision site)   Complete by: As directed    Call MD for:  severe uncontrolled pain   Complete by: As directed    Call MD for:  temperature >100.4   Complete by: As directed    Diet general   Complete by: As directed    Driving Restrictions   Complete by: As directed    Do not drive until given clearance.   Increase activity slowly   Complete by: As directed    Increase activity slowly   Complete by: As directed    Lifting restrictions   Complete by: As directed    Do not lift anything >10lbs. Avoid bending and twisting in awkward positions. Avoid bending  at the back.   May shower / Bathe   Complete by: As directed    In 24 hours. Okay to wash wound with warm soapy water. Avoid scrubbing the wound. Pat dry.   No wound care   Complete by: As directed    Remove dressing in 48 hours   Complete by: As directed      Allergies as of 05/30/2020      Reactions   Sulfa Antibiotics Shortness Of Breath   Sulfamethoxazole Anaphylaxis   Duloxetine Other (See Comments)   nervous   Gabapentin Swelling   Nsaids Other (See Comments)   Stomach burns   Oseltamivir Swelling   Tongue swelling   Prednisone    Pregabalin Swelling   Tongue swells      Medication List    STOP taking these medications   tiZANidine 4 MG tablet Commonly known as: ZANAFLEX     TAKE these medications   albuterol 108 (90 Base) MCG/ACT inhaler Commonly known as: VENTOLIN HFA Inhale 1-2 puffs into the lungs every 6 (six) hours as needed for wheezing or shortness of breath.   allopurinol 300 MG tablet Commonly known as: ZYLOPRIM Take 300 mg by mouth daily.   ALPRAZolam 0.5 MG tablet Commonly known as: XANAX Take 0.5 mg by mouth as needed for anxiety.   busPIRone 10 MG tablet Commonly known as: BUSPAR Take 10 mg by mouth 2 (two) times daily.   cloNIDine 0.1 MG tablet Commonly known as: CATAPRES  Take 0.1 mg by mouth 3 (three) times daily.   esomeprazole 40 MG capsule Commonly known as: NexIUM Take 1 capsule (40 mg total) by mouth daily at 12 noon.   glipiZIDE 2.5 MG 24 hr tablet Commonly known as: GLUCOTROL XL Take 2.5 mg by mouth in the morning and at bedtime.   labetalol 100 MG tablet Commonly known as: NORMODYNE Take 100 mg by mouth 2 (two) times daily.   levocetirizine 5 MG tablet Commonly known as: XYZAL Take 5 mg by mouth every evening.   levothyroxine 100 MCG tablet Commonly known as: SYNTHROID Take 100 mcg by mouth daily before breakfast.   losartan 50 MG tablet Commonly known as: COZAAR Take 50 mg by mouth in the morning and at  bedtime.   metFORMIN 500 MG 24 hr tablet Commonly known as: GLUCOPHAGE-XR Take 500 mg by mouth in the morning and at bedtime.   methocarbamol 750 MG tablet Commonly known as: Robaxin-750 Take 1 tablet (750 mg total) by mouth 3 (three) times daily as needed for muscle spasms.   oxyCODONE-acetaminophen 10-325 MG tablet Commonly known as: PERCOCET Take 1 tablet by mouth every 4 (four) hours as needed for pain. What changed: when to take this   traZODone 100 MG tablet Commonly known as: DESYREL Take 100 mg by mouth at bedtime.            Durable Medical Equipment  (From admission, onward)         Start     Ordered   05/29/20 1049  DME Walker  Once    Question Answer Comment  Walker: With 5 Inch Wheels   Patient needs a walker to treat with the following condition Gait instability      05/29/20 1048   05/29/20 1049  DME 3-in-1  Once     05/29/20 1048   05/29/20 1043  For home use only DME Walker rolling  Once    Question Answer Comment  Walker: With [place] Wheels   Patient needs a walker to treat with the following condition S/P lumbar fusion      05/29/20 1043         Follow-up Information    [facility] Follow up.   Specialty: Home Health Services Why: The home health agency will contact you for the first home visit. Contact information: [address] [place] [address] [phone]        Consuella Lose, MD. Schedule an appointment as soon as possible for a visit in 3 week(s).   Specialty: Neurosurgery Contact information: [address] [place] [address] [place] [address] [phone]           Signed: Traci Sermon 05/30/2020, 7:27 AM

## 2021-05-19 ENCOUNTER — Telehealth: Payer: Self-pay | Admitting: Gastroenterology

## 2021-05-19 DIAGNOSIS — D509 Iron deficiency anemia, unspecified: Secondary | ICD-10-CM

## 2021-05-19 DIAGNOSIS — R1011 Right upper quadrant pain: Secondary | ICD-10-CM

## 2021-05-19 DIAGNOSIS — G8929 Other chronic pain: Secondary | ICD-10-CM

## 2021-05-19 NOTE — Telephone Encounter (Signed)
Received CT Abdo/pelvis report from [facility] performed on 05/16/2021 -Hepatomegaly with mild diffuse hepatic steatosis -Wall thickening vs incompletely distended colon at hepatic flexure.  Recommend colonoscopy -Colonic diverticulosis without diverticulitis -Aortic atherosclerosis.   Plan: -Proceed with colonoscopy with 2-day prep at [facility] -Check CBC, CMP at the time of colonoscopy   RG

## 2021-05-22 NOTE — Telephone Encounter (Signed)
Labs placed in Epic and patient is scheduled for 7-18. She have an appointment with you on June 28th.   Patient said that she has been having abdominal pain for a while now. Christy Long it started out as a little pain but now when she bends over or to the left and twist the pain shoots up and that her abdomen area swells throughout the day. Please advise

## 2021-05-22 NOTE — Addendum Note (Signed)
Addended by: Curlene Labrum E on: 05/22/2021 09:18 AM   Modules accepted: Orders

## 2021-05-23 MED ORDER — DICYCLOMINE HCL 10 MG PO CAPS
10.0000 mg | ORAL_CAPSULE | Freq: Two times a day (BID) | ORAL | 3 refills | Status: DC | PRN
Start: 2021-05-23 — End: 2021-06-19

## 2021-05-23 NOTE — Addendum Note (Signed)
Addended by: Curlene Labrum E on: 05/23/2021 04:02 PM   Modules accepted: Orders

## 2021-05-23 NOTE — Telephone Encounter (Signed)
Lets try Bentyl 10 mg p.o. twice daily as needed, #60 for pain until her colonoscopy. She would need 2-day prep as before. We can go ahead and check CBC, CMP, CRP RG

## 2021-05-23 NOTE — Telephone Encounter (Addendum)
Medication sent to the pharmacy and I have requested last ov and labs from her PCP. She said she had lab work done last Monday. I will placed on your desk. I told if we needed additional lab work we will call her

## 2021-05-25 NOTE — Telephone Encounter (Signed)
Records placed in your box for review

## 2021-06-04 ENCOUNTER — Telehealth: Payer: Self-pay | Admitting: Gastroenterology

## 2021-06-04 NOTE — Telephone Encounter (Signed)
Patient calling to inform she is experiencing rectum pain/swelling.. pt wants to know how to treat sxs..  Plz advise

## 2021-06-04 NOTE — Telephone Encounter (Signed)
Pt calling back again states she is still having pain from her navel up to the right side that goes up to her breast. States the pain is worse when she bends or twists. She states she has taken the bentyl but it has not helped at all. She reports she has changed to a "light" diet and this has not helped either. Pt wants to know what Dr. Lyndel Safe recommends. Please advise.

## 2021-06-06 ENCOUNTER — Other Ambulatory Visit: Payer: Self-pay

## 2021-06-06 DIAGNOSIS — D509 Iron deficiency anemia, unspecified: Secondary | ICD-10-CM

## 2021-06-06 DIAGNOSIS — K2970 Gastritis, unspecified, without bleeding: Secondary | ICD-10-CM

## 2021-06-06 DIAGNOSIS — R1011 Right upper quadrant pain: Secondary | ICD-10-CM

## 2021-06-06 MED ORDER — FLUTICASONE PROPIONATE 0.05 % EX CREA: TOPICAL_CREAM | CUTANEOUS | 2 refills | Status: AC

## 2021-06-06 MED ORDER — DICYCLOMINE HCL 10 MG PO CAPS
10.0000 mg | ORAL_CAPSULE | Freq: Three times a day (TID) | ORAL | 0 refills | Status: DC
Start: 2021-06-06 — End: 2022-02-26

## 2021-06-06 NOTE — Telephone Encounter (Signed)
Lab orders in epic, scripts sent to pharmacy. Pt already scheduled for colon, she knows to come for labs.Pt knows to pick up the prescriptions.

## 2021-06-08 ENCOUNTER — Other Ambulatory Visit (INDEPENDENT_AMBULATORY_CARE_PROVIDER_SITE_OTHER): Payer: Medicare Other

## 2021-06-08 DIAGNOSIS — D509 Iron deficiency anemia, unspecified: Secondary | ICD-10-CM | POA: Diagnosis not present

## 2021-06-08 DIAGNOSIS — K2970 Gastritis, unspecified, without bleeding: Secondary | ICD-10-CM | POA: Diagnosis not present

## 2021-06-08 DIAGNOSIS — R1011 Right upper quadrant pain: Secondary | ICD-10-CM | POA: Diagnosis not present

## 2021-06-08 LAB — COMPREHENSIVE METABOLIC PANEL
ALT: 12 U/L (ref 0–35)
AST: 13 U/L (ref 0–37)
Albumin: 4.6 g/dL (ref 3.5–5.2)
Alkaline Phosphatase: 130 U/L — ABNORMAL HIGH (ref 39–117)
BUN: 29 mg/dL — ABNORMAL HIGH (ref 6–23)
CO2: 27 mEq/L (ref 19–32)
Calcium: 10.4 mg/dL (ref 8.4–10.5)
Chloride: 100 mEq/L (ref 96–112)
Creatinine, Ser: 1.22 mg/dL — ABNORMAL HIGH (ref 0.40–1.20)
GFR: 47.36 mL/min — ABNORMAL LOW (ref >60.00)
Glucose, Bld: 163 mg/dL — ABNORMAL HIGH (ref 70–99)
Potassium: 4.9 mEq/L (ref 3.5–5.1)
Sodium: 137 mEq/L (ref 135–145)
Total Bilirubin: 0.4 mg/dL (ref 0.2–1.2)
Total Protein: 7.5 g/dL (ref 6.0–8.3)

## 2021-06-08 LAB — CBC WITH DIFFERENTIAL/PLATELET
Basophils Absolute: 0.1 10*3/uL (ref 0.0–0.1)
Basophils Relative: 1.2 % (ref 0.0–3.0)
Eosinophils Absolute: 0.4 10*3/uL (ref 0.0–0.7)
Eosinophils Relative: 4.7 % (ref 0.0–5.0)
HCT: 31.7 % — ABNORMAL LOW (ref 36.0–46.0)
Hemoglobin: 10.6 g/dL — ABNORMAL LOW (ref 12.0–15.0)
Lymphocytes Relative: 36.1 % (ref 12.0–46.0)
Lymphs Abs: 2.8 10*3/uL (ref 0.7–4.0)
MCHC: 33.3 g/dL (ref 30.0–36.0)
MCV: 86.1 fl (ref 78.0–100.0)
Monocytes Absolute: 0.5 10*3/uL (ref 0.1–1.0)
Monocytes Relative: 6.2 % (ref 3.0–12.0)
Neutro Abs: 4 10*3/uL (ref 1.4–7.7)
Neutrophils Relative %: 51.8 % (ref 43.0–77.0)
Platelets: 224 10*3/uL (ref 150.0–400.0)
RBC: 3.69 Mil/uL — ABNORMAL LOW (ref 3.87–5.11)
RDW: 14.8 % (ref 11.5–15.5)
WBC: 7.7 10*3/uL (ref 4.0–10.5)

## 2021-06-08 LAB — C-REACTIVE PROTEIN: CRP: 1.2 mg/dL (ref 0.5–20.0)

## 2021-06-19 ENCOUNTER — Encounter: Payer: Self-pay | Admitting: Gastroenterology

## 2021-06-19 ENCOUNTER — Ambulatory Visit (INDEPENDENT_AMBULATORY_CARE_PROVIDER_SITE_OTHER): Payer: Medicare Other | Admitting: Gastroenterology

## 2021-06-19 ENCOUNTER — Other Ambulatory Visit: Payer: Self-pay

## 2021-06-19 VITALS — BP 138/90 | HR 76 | Ht 65.0 in | Wt 211.2 lb

## 2021-06-19 DIAGNOSIS — R9389 Abnormal findings on diagnostic imaging of other specified body structures: Secondary | ICD-10-CM

## 2021-06-19 DIAGNOSIS — R1011 Right upper quadrant pain: Secondary | ICD-10-CM

## 2021-06-19 DIAGNOSIS — K582 Mixed irritable bowel syndrome: Secondary | ICD-10-CM

## 2021-06-19 MED ORDER — NA SULFATE-K SULFATE-MG SULF 17.5-3.13-1.6 GM/177ML PO SOLN: 1.0000 | Freq: Once | ORAL | 0 refills | Status: AC

## 2021-06-19 NOTE — Progress Notes (Signed)
Chief Complaint: Multiple GI problems  Referring Provider:  Zoila Shutter, NP      ASSESSMENT AND PLAN;   #1.  Chronic RUQ with neg WU as below. S/P lap chole. Now with Abn CT 04/26/2021 showing hepatic flexure thickening    Neg extensive work-up _0  including EGD, colon 05/2012, MRCP 2018, Meckel's scan 2018, [place], [place]. Neg EGD with SB bx and GES 12/2019. H2 breath test suspicious for SIBO 02/2020, Rxed with rifaximin. S/P lap chole  #2.  GERD with intermittent eso dysphagia (resolved).  #3.  IBS with alternating diarrhea and constipation. Neg colon with random Bx 05/2012. next routine colon due in 76yr. SIBO Aprl 2, 2021, Rxed with rifaxamin  #4.  Fatty liver with abn alk phos. Neg MRCP 2018. S/p cholecystectomy.  #5.  H/O IDA/B12 def (Hb 10.9 02/2020)  #6. Comorbid conditions include anxiety/depression, chronic pain syndrome on narcotics, HTN, HLD, DM2, hypothyroidism, gout, chronic fatigue syndrome, fibromyalgia.   Plan: -Colon with 2 day prep -Lose wt 10lb over 3 months -Nexium 40 mg p.o. QD, 30, 6 refills -Minimize pain medications. -She will get in touch with Dr HJannette Fogo ?  Rheumatology consultation for ? Fibromyalgia.  Addendum: Got few records 10/13/2019 Hb 11.7, MCV 86, plt 272, WBC count 10 K, B12 169 (being replaced). Nl LFTs except for alk phos 170. Nl BUN/creatinine 15/0.78..Marland KitchenTSH 4.3.  Urine tox screen positive for opioids.  Hemoglobin A1c 7.1  HPI:    Christy AYEis a 65y.o. female   Seen for follow-up due to abnormal CT Abdo/pelvis with contrast May 16, 2021 showing wall thickening at hepatic flexure-nonspecific -could represent mass versus incomplete distention.  Recommend correlation with colonoscopy.  CT also showed fatty liver.  She has been having chronic RUQ abdo pain over several years which used to be intermittent and now has become more constant.  Neg EGD Jan 2021 with neg eso and SB Bx.  Thereafter had Nl solid-phase GES 01/2020.  She also  underwent empiric esophageal dilatation at the time of last endoscopy with resolution of dysphagia. SIBO breath test 02/2020 was suspicious for bacterial overgrowth.  She underwent a trial of rifaximin 550 mg TID for 2 weeks without any significant relief.  No jaundice dark urine or pale stools.  Note that she is s/p cholecystectomy.   From previous records  -stomach problems "all my life". Dx with IBS with alternating diarrhea and constipation several years ago.  Associated abdominal bloating and crampy abdominal pain.  Also has nausea.  No heartburn as long as she is on Nexium.  " Having pain all over the body"  Has alternating diarrhea and constipation with abdominal bloating.  When she is having diarrhea, she would have 4-5 bowel movements per day, mushy, without nocturnal symptoms, with mucus but no blood.  When she gets constipated she would have pellet-like stools.  Just before her menstrual cycles or when she is under stress, diarrhea gets worse.  Had extensive work-up in the past as described below.  The GI symptoms get worse with stress and anxiety.   Does admit that she has not been sleeping well.  Wt Readings from Last 3 Encounters:  06/19/21 211 lb 4 oz (95.8 kg)  05/26/20 222 lb (100.7 kg)  05/24/20 220 lb 8 oz (100 kg)     Previous GI work-up: CT Abdo/pelvis with p.o. and IV contrast May 16, 2021 -Hepatomegaly with mild diffuse hepatic steatosis. -Wall thickening hepatic flexure. -Colonic diverticulosis without diverticulitis -Aortic  atherosclerosis     _0 : MRCP 09/2017: Hepatomegaly, s/p cholecystectomy.  Mildly prominent CBD 8 mm.  No choledocholithiasis.  Normal PD.  EGD 12/2019 -Moderate gastritis. -Otherwise normal EGD. -S/P empiric esophageal dilatation.  EGD with small bowel biopsies 06/04/2012: neg except for erosive esophagitis LA gd A, neg SB bx, neg CLO. Colonoscopy 06/04/2012: Normal except for small hyperplastic polyps, neg random colonic  bx. Hydrogen breath test for SIBO-results awaited. Meckel scan 09/2017: neg [place] 03/2017: CT 2016: neg per notes, report awaited. Past Medical History:  Diagnosis Date   Allergic rhinitis    Anxiety disorder    Arthritis    Chronic fatigue    Chronic pain disorder    Cramp and spasm    Essential (primary) hypertension    Fibromyalgia    Gout    Heart rate fast    per patient happens during anxiety    Hyperlipemia, mixed    Hypothyroidism    IBS (irritable bowel syndrome)    Indigestion    Insomnia    Low back pain    Nontoxic single thyroid nodule    Other vitamin B12 deficiency anemias    Pneumonia    PONV (postoperative nausea and vomiting)    per patient gets a N/V and headache upon waking up from anesthesia   Type 2 diabetes mellitus ([place])    Unspecified menopausal and perimenopausal disorder    Vitamin D deficiency     Past Surgical History:  Procedure Laterality Date   ANKLE SURGERY     x5   BACK SURGERY     2nd back surgery , 1st - 9 /2019, 2nd - 05/25/20   BLADDER SURGERY  1999   bladder tack   CESAREAN SECTION     x3   CHOLECYSTECTOMY     COLONOSCOPY WITH ESOPHAGOGASTRODUODENOSCOPY (EGD)  05/2012   LUMBAR FUSION  09/2018   SHOULDER SURGERY  2011   TOTAL ABDOMINAL HYSTERECTOMY      Family History  Problem Relation Age of Onset   Diabetes Mother    Colon cancer Neg Hx    Pancreatic cancer Neg Hx    Stomach cancer Neg Hx    Esophageal cancer Neg Hx    Liver disease Neg Hx     Social History   Tobacco Use   Smoking status: Former    Pack years: 0.00    Types: Cigarettes    Quit date: 1994    Years since quitting: 28.5   Smokeless tobacco: Never  Vaping Use   Vaping Use: Never used  Substance Use Topics   Alcohol use: Never   Drug use: Never    Current Outpatient Medications  Medication Sig Dispense Refill   albuterol (VENTOLIN HFA) 108 (90 Base) MCG/ACT inhaler Inhale 1-2 puffs into the lungs every 6 (six) hours as needed for wheezing or  shortness of breath.      allopurinol (ZYLOPRIM) 300 MG tablet Take 300 mg by mouth daily.     ALPRAZolam (XANAX) 0.5 MG tablet Take 0.5 mg by mouth as needed for anxiety.     busPIRone (BUSPAR) 10 MG tablet Take 10 mg by mouth 2 (two) times daily.     cloNIDine (CATAPRES) 0.1 MG tablet Take 0.1 mg by mouth 3 (three) times daily.      dicyclomine (BENTYL) 10 MG capsule Take 1 capsule (10 mg total) by mouth 3 (three) times daily before meals. (Patient taking differently: Take 10 mg by mouth 4 (four) times daily.) 120 capsule 0  esomeprazole (NEXIUM) 40 MG capsule Take 1 capsule (40 mg total) by mouth daily at 12 noon. 30 capsule 6   fluticasone (CUTIVATE) 0.05 % cream Apply per rectum twice a day for 10 days 30 g 2   glipiZIDE (GLUCOTROL XL) 2.5 MG 24 hr tablet Take 2.5 mg by mouth in the morning and at bedtime.     labetalol (NORMODYNE) 100 MG tablet Take 100 mg by mouth 2 (two) times daily.     levocetirizine (XYZAL) 5 MG tablet Take 5 mg by mouth every evening.     levothyroxine (SYNTHROID) 100 MCG tablet Take 100 mcg by mouth daily before breakfast.     losartan (COZAAR) 50 MG tablet Take 50 mg by mouth in the morning and at bedtime.      metFORMIN (GLUCOPHAGE-XR) 500 MG 24 hr tablet Take 500 mg by mouth in the morning and at bedtime.     oxyCODONE-acetaminophen (PERCOCET) 10-325 MG tablet Take 1 tablet by mouth every 4 (four) hours as needed for pain. 60 tablet 0   traZODone (DESYREL) 100 MG tablet Take 100 mg by mouth at bedtime.     No current facility-administered medications for this visit.    Allergies  Allergen Reactions   Sulfa Antibiotics Shortness Of Breath   Sulfamethoxazole Anaphylaxis   Duloxetine Other (See Comments)    nervous    Gabapentin Swelling   Nsaids Other (See Comments)    Stomach burns    Oseltamivir Swelling    Tongue swelling    Prednisone    Pregabalin Swelling    Tongue swells     Review of Systems:  Constitutional: Denies fever, chills,  diaphoresis, appetite change and has fatigue.  HEENT: Denies photophobia, eye pain, redness, hearing loss, ear pain, congestion, sore throat, rhinorrhea, sneezing, mouth sores, neck pain, neck stiffness and tinnitus.   Respiratory: Denies SOB, DOE, cough, chest tightness,  and wheezing.   Cardiovascular: Denies chest pain, palpitations and leg swelling.  Genitourinary: Denies dysuria, urgency, frequency, hematuria, flank pain and difficulty urinating.  Musculoskeletal: Has myalgias, back pain, joint swelling, arthralgias and gait problem.  Skin: No rash.  Neurological: Denies dizziness, seizures, syncope, weakness, light-headedness, numbness and headaches.  Hematological: Denies adenopathy. Easy bruising, personal or family bleeding history  Psychiatric/Behavioral: Has anxiety or depression     Physical Exam:    BP 138/90   Pulse 76   Ht _0  (1.651 m)   Wt 211 lb 4 oz (95.8 kg)   SpO2 99%   BMI 35.15 kg/m  Wt Readings from Last 3 Encounters:  06/19/21 211 lb 4 oz (95.8 kg)  05/26/20 222 lb (100.7 kg)  05/24/20 220 lb 8 oz (100 kg)  Gen: awake, alert, NAD HEENT: anicteric, no pallor CV: RRR, no mrg Pulm: CTA b/l Abd: soft, generalized abdominal tenderness/abdominal wall tenderness, +BS throughout Ext: no c/c/e Neuro: nonfocal      Carmell Austria, MD 06/19/2021, 9:46 AM  Cc: Zoila Shutter, NP

## 2021-06-19 NOTE — Patient Instructions (Addendum)
If you are age 63 or older, your body mass index should be between 23-30. Your Body mass index is 35.15 kg/m. If this is out of the aforementioned range listed, please consider follow up with your Primary Care Provider.  If you are age 77 or younger, your body mass index should be between 19-25. Your Body mass index is 35.15 kg/m. If this is out of the aformentioned range listed, please consider follow up with your Primary Care Provider.   __________________________________________________________  The [facility] providers would like to encourage you to use [facility] to communicate with providers for non-urgent requests or questions.  Due to long hold times on the telephone, sending your provider a message by [facility] may be a faster and more efficient way to get a response.  Please allow 48 business hours for a response.  Please remember that this is for non-urgent requests.   You have been scheduled for a colonoscopy. Please follow written instructions given to you at your visit today.  Please pick up your prep supplies at the pharmacy within the next 1-3 days. If you use inhalers (even only as needed), please bring them with you on the day of your procedure.   Please talk with your PCP regarding a referral to a rheumatologist.  Please lose 10 pounds over the next 3 months.  Thank you,  Dr. Jackquline Denmark

## 2021-06-22 ENCOUNTER — Ambulatory Visit (AMBULATORY_SURGERY_CENTER): Payer: Medicare Other | Admitting: Gastroenterology

## 2021-06-22 ENCOUNTER — Other Ambulatory Visit: Payer: Self-pay

## 2021-06-22 ENCOUNTER — Encounter: Payer: Self-pay | Admitting: Gastroenterology

## 2021-06-22 VITALS — BP 167/94 | HR 81 | Temp 98.0°F | Resp 17 | Ht 65.0 in | Wt 211.0 lb

## 2021-06-22 DIAGNOSIS — R9389 Abnormal findings on diagnostic imaging of other specified body structures: Secondary | ICD-10-CM | POA: Diagnosis not present

## 2021-06-22 DIAGNOSIS — K648 Other hemorrhoids: Secondary | ICD-10-CM | POA: Diagnosis not present

## 2021-06-22 DIAGNOSIS — K5730 Diverticulosis of large intestine without perforation or abscess without bleeding: Secondary | ICD-10-CM

## 2021-06-22 DIAGNOSIS — K635 Polyp of colon: Secondary | ICD-10-CM | POA: Diagnosis not present

## 2021-06-22 DIAGNOSIS — D124 Benign neoplasm of descending colon: Secondary | ICD-10-CM

## 2021-06-22 DIAGNOSIS — D125 Benign neoplasm of sigmoid colon: Secondary | ICD-10-CM

## 2021-06-22 DIAGNOSIS — R1011 Right upper quadrant pain: Secondary | ICD-10-CM

## 2021-06-22 MED ORDER — SODIUM CHLORIDE 0.9 % IV SOLN: 500.0000 mL | Freq: Once | INTRAVENOUS | Status: DC

## 2021-06-22 NOTE — Progress Notes (Signed)
Report to PACU, RN, vss, BBS= Clear.  

## 2021-06-22 NOTE — Progress Notes (Signed)
Called to room to assist during endoscopic procedure.  Patient ID and intended procedure confirmed with present staff. Received instructions for my participation in the procedure from the performing physician.  

## 2021-06-22 NOTE — Patient Instructions (Signed)
YOU HAD AN ENDOSCOPIC PROCEDURE TODAY AT [facility]:   Refer to the procedure report that was given to you for any specific questions about what was found during the examination.  If the procedure report does not answer your questions, please call your gastroenterologist to clarify.  If you requested that your care partner not be given the details of your procedure findings, then the procedure report has been included in a sealed envelope for you to review at your convenience later.  YOU SHOULD EXPECT: Some feelings of bloating in the abdomen. Passage of more gas than usual.  Walking can help get rid of the air that was put into your GI tract during the procedure and reduce the bloating. If you had a lower endoscopy (such as a colonoscopy or flexible sigmoidoscopy) you may notice spotting of blood in your stool or on the toilet paper. If you underwent a bowel prep for your procedure, you may not have a normal bowel movement for a few days.  Please Note:  You might notice some irritation and congestion in your nose or some drainage.  This is from the oxygen used during your procedure.  There is no need for concern and it should clear up in a day or so.  SYMPTOMS TO REPORT IMMEDIATELY:  Following lower endoscopy (colonoscopy or flexible sigmoidoscopy):  Excessive amounts of blood in the stool  Significant tenderness or worsening of abdominal pains  Swelling of the abdomen that is new, acute  Fever of 100F or higher    For urgent or emergent issues, a gastroenterologist can be reached at any hour by calling [phone]. Do not use MyChart messaging for urgent concerns.    DIET:  We do recommend a small meal at first, but then you may proceed to your regular diet.  Drink plenty of fluids but you should avoid alcoholic beverages for 24 hours.  ACTIVITY:  You should plan to take it easy for the rest of today and you should NOT DRIVE or use heavy machinery until tomorrow (because  of the sedation medicines used during the test).    FOLLOW UP: Our staff will call the number listed on your records 48-72 hours following your procedure to check on you and address any questions or concerns that you may have regarding the information given to you following your procedure. If we do not reach you, we will leave a message.  We will attempt to reach you two times.  During this call, we will ask if you have developed any symptoms of COVID 19. If you develop any symptoms (ie: fever, flu-like symptoms, shortness of breath, cough etc.) before then, please call [phone].  If you test positive for Covid 19 in the 2 weeks post procedure, please call and report this information to [place].    If any biopsies were taken you will be contacted by phone or by letter within the next 1-3 weeks.  Please call us at [phone] if you have not heard about the biopsies in 3 weeks.    SIGNATURES/CONFIDENTIALITY: You and/or your care partner have signed paperwork which will be entered into your electronic medical record.  These signatures attest to the fact that that the information above on your After Visit Summary has been reviewed and is understood.  Full responsibility of the confidentiality of this discharge information lies with you and/or your care-partner.    Resume medications. Recommend miralax 17g ( 1 capful once daily) Information given on polyps.

## 2021-06-22 NOTE — Progress Notes (Signed)
[place]  History reviewed.

## 2021-06-22 NOTE — Op Note (Signed)
[place] Patient Name: Christy Long Procedure Date: 06/22/2021 3:37 PM MRN: [id number] Endoscopist: Jackquline Denmark , MD Age: 65 Referring MD:  Date of Birth: [birth year] Gender: Female Account #: [id number] Procedure:                Colonoscopy Indications:              Abnormal CT of the GI tract showing questionable                            hepatic flexure lesion. Medicines:                Monitored Anesthesia Care Procedure:                Pre-Anesthesia Assessment:                           - Prior to the procedure, a History and Physical                            was performed, and patient medications and                            allergies were reviewed. The patient's tolerance of                            previous anesthesia was also reviewed. The risks                            and benefits of the procedure and the sedation                            options and risks were discussed with the patient.                            All questions were answered, and informed consent                            was obtained. Prior Anticoagulants: The patient has                            taken no previous anticoagulant or antiplatelet                            agents. ASA Grade Assessment: III - A patient with                            severe systemic disease. After reviewing the risks                            and benefits, the patient was deemed in                            satisfactory condition to undergo the procedure.  After obtaining informed consent, the colonoscope                            was passed under direct vision. Throughout the                            procedure, the patient's blood pressure, pulse, and                            oxygen saturations were monitored continuously. The                            CF [id number] #[id number] was introduced through the anus                            and advanced to the the cecum,  identified by                            appendiceal orifice and ileocecal valve. The                            colonoscopy was performed without difficulty. The                            patient tolerated the procedure well. The quality                            of the bowel preparation was good. The ileocecal                            valve, appendiceal orifice, and rectum were                            photographed. Scope In: 4:05:07 PM Scope Out: 4:25:47 PM Scope Withdrawal Time: 0 hours 12 minutes 27 seconds  Total Procedure Duration: 0 hours 20 minutes 40 seconds  Findings:                 Two sessile polyps were found in the distal sigmoid                            colon and mid descending colon. The polyps were 4                            to 6 mm in size. These polyps were removed with a                            cold snare. Resection and retrieval were complete.                           A few small-mouthed diverticula were found in the                            sigmoid colon.  Non-bleeding internal hemorrhoids were found during                            retroflexion. The hemorrhoids were moderate.                           The exam was otherwise without abnormality on                            direct and retroflexion views. The hepatic flexure                            was carefully examined. There were no masses. The                            colon was highly redundant. Complications:            No immediate complications. Estimated Blood Loss:     Estimated blood loss: none. Impression:               - Two 4 to 6 mm polyps in the distal sigmoid colon                            and in the mid descending colon, removed with a                            cold snare. Resected and retrieved.                           - Minimal sigmoid diverticulosis.                           - Non-bleeding internal hemorrhoids.                           - The  examination was otherwise normal on direct                            and retroflexion views. Recommendation:           - Patient has a contact number available for                            emergencies. The signs and symptoms of potential                            delayed complications were discussed with the                            patient. Return to normal activities tomorrow.                            Written discharge instructions were provided to the                            patient.                           -  Resume previous diet.                           - Continue present medications.                           - Await pathology results.                           - Repeat colonoscopy for surveillance based on                            pathology results.                           - MiraLAX 17 g p.o. once a day.                           - The findings and recommendations were discussed                            with the patient's family. Jackquline Denmark, MD 06/22/2021 4:33:45 PM This report has been signed electronically.

## 2021-06-27 ENCOUNTER — Telehealth: Payer: Self-pay

## 2021-06-27 NOTE — Telephone Encounter (Signed)
  Follow up Call-  Call back number 06/22/2021 01/20/2020  Post procedure Call Back phone  # [phone]  Permission to leave phone message Yes Yes  Some recent data might be hidden     Patient questions:  Do you have a fever, pain , or abdominal swelling? No. Pain Score  0 *  Have you tolerated food without any problems? Yes.    Have you been able to return to your normal activities? Yes.    Do you have any questions about your discharge instructions: Diet   No. Medications  No. Follow up visit  No.  Do you have questions or concerns about your Care? No.  Actions: * If pain score is 4 or above: No action needed, pain <4.   Have you developed a fever since your procedure? No   2.   Have you had an respiratory symptoms (SOB or cough) since your procedure? No   3.   Have you tested positive for COVID 19 since your procedure no   4.   Have you had any family members/close contacts diagnosed with the COVID 19 since your procedure?  No    If yes to any of these questions please route to Joylene John, RN and Joella Prince, RN

## 2021-07-07 ENCOUNTER — Encounter: Payer: Self-pay | Admitting: Gastroenterology

## 2021-07-09 ENCOUNTER — Encounter: Payer: Medicare Other | Admitting: Gastroenterology

## 2021-10-26 ENCOUNTER — Ambulatory Visit (INDEPENDENT_AMBULATORY_CARE_PROVIDER_SITE_OTHER): Payer: Medicare Other | Admitting: Gastroenterology

## 2021-10-26 ENCOUNTER — Other Ambulatory Visit: Payer: Self-pay

## 2021-10-26 ENCOUNTER — Encounter: Payer: Self-pay | Admitting: Gastroenterology

## 2021-10-26 ENCOUNTER — Other Ambulatory Visit (INDEPENDENT_AMBULATORY_CARE_PROVIDER_SITE_OTHER): Payer: Medicare Other

## 2021-10-26 VITALS — BP 136/84 | HR 80 | Ht 65.0 in | Wt 215.4 lb

## 2021-10-26 DIAGNOSIS — K760 Fatty (change of) liver, not elsewhere classified: Secondary | ICD-10-CM

## 2021-10-26 DIAGNOSIS — K2100 Gastro-esophageal reflux disease with esophagitis, without bleeding: Secondary | ICD-10-CM

## 2021-10-26 DIAGNOSIS — K582 Mixed irritable bowel syndrome: Secondary | ICD-10-CM

## 2021-10-26 DIAGNOSIS — G8929 Other chronic pain: Secondary | ICD-10-CM

## 2021-10-26 DIAGNOSIS — D509 Iron deficiency anemia, unspecified: Secondary | ICD-10-CM

## 2021-10-26 DIAGNOSIS — R1011 Right upper quadrant pain: Secondary | ICD-10-CM

## 2021-10-26 MED ORDER — ESOMEPRAZOLE MAGNESIUM 40 MG PO CPDR: 40.0000 mg | DELAYED_RELEASE_CAPSULE | Freq: Every day | ORAL | 6 refills | Status: AC

## 2021-10-26 MED ORDER — DICYCLOMINE HCL 10 MG PO CAPS: 10.0000 mg | ORAL_CAPSULE | Freq: Three times a day (TID) | ORAL | 2 refills | Status: DC

## 2021-10-26 NOTE — Addendum Note (Signed)
Addended by: Jeronimo Greaves on: 10/26/2021 03:19 PM   Modules accepted: Orders

## 2021-10-26 NOTE — Patient Instructions (Signed)
If you are age 63 or older, your body mass index should be between 23-30. Your Body mass index is 35.84 kg/m. If this is out of the aforementioned range listed, please consider follow up with your Primary Care Provider.  If you are age 77 or younger, your body mass index should be between 19-25. Your Body mass index is 35.84 kg/m. If this is out of the aformentioned range listed, please consider follow up with your Primary Care Provider.   __________________________________________________________  The [facility] providers would like to encourage you to use [facility] to communicate with providers for non-urgent requests or questions.  Due to long hold times on the telephone, sending your provider a message by [facility] may be a faster and more efficient way to get a response.  Please allow 48 business hours for a response.  Please remember that this is for non-urgent requests.   Please go to the lab on the 2nd floor [address] before you leave the office today.   We have sent the following medications to your pharmacy for you to pick up at your convenience:  Please go to the radiology department on the 1st floor and schedule your ultrasound.  It was a pleasure to see you today!  Jackquline Denmark, M.D.

## 2021-10-26 NOTE — Progress Notes (Signed)
Chief Complaint: FU  Referring Provider:  Zoila Shutter, NP      ASSESSMENT AND PLAN;   #1. Chronic RUQ with neg WU as below. Likely combination of musculoskeletal pain related to fibromyalgia and fatty liver. S/P lap chole.  Neg extensive WU including EGD with SB bx and GES 12/2019, colon 06/2021, MRCP 2018, Meckel's scan 2018, [place], Cts as below.  H2 breath test suspicious for SIBO 02/2020, Rxed with rifaximin without benefit. S/P lap chole  #2.  GERD with intermittent eso dysphagia (resolved).  #3.  IBS with alternating diarrhea and constipation. Neg colon with random Bx 05/2012, 06/2021. SIBO Aprl 2, 2021, Rxed with rifaxamin  #4.  Fatty liver with abn alk phos. Neg MRCP 2018. S/p cholecystectomy.  #5.  H/O IDA/B12 def (Hb 10.9 02/2020)  #6.  Comorbid conditions include obesity, anxiety/depression, chronic pain syndrome on narcotics, HTN, HLD, DM2, hypothyroidism, gout, chronic fatigue syndrome, fibromyalgia.   Plan:  -[place] abdo complete -Check CBC, CMP, acute viral hepatitis, autoimmune hepatitis panel (AMA, ASMA) serum ceruloplasmin, A1AT and GGT. -Check anti-HAV total Ab and HBsAb titer.  If neg, would recommend vaccination. -Lose wt 10lb over 3 months by watching p.o. intake and start exercising like walking. -Nexium 40 mg p.o. QD, 30, 6 refills -Bentyl 86m po qid prn #120, 2 refills. -Minimize pain medications. -FU in 12 weeks.    HPI:    Christy MARISis a 65y.o. female   Seen by Dr. HJannette Fogoand found to have increasing LFTs. (We do not have those records currently).  She also had continued RUQ pain.   C/O chronic RUQ abdo pain x several years which used to be intermittent and now is more constant.  Neg EGD Jan 2021 with neg eso and SB Bx.  Thereafter had Nl solid-phase GES 01/2020.  She also underwent empiric eso dil at the time of last endoscopy with resolution of dysphagia. SIBO breath test 02/2020 was suspicious for bacterial overgrowth.  She underwent a trial of  rifaximin 550 mg TID for 2 weeks without any significant relief.  No jaundice dark urine or pale stools.  Note that she is s/p cholecystectomy.  No H/O itching, skin lesions, easy bruisability, intake of OTC meds including diet pills, herbal medications, anabolic steroids or Tylenol. There is no H/O blood transfusions, IVDA or FH of liver disease. No jaundice, dark urine or pale stools. No alcohol abuse.  She continues to have alternating diarrhea and constipation with abdominal bloating.  Had neg recent colonoscopy as below.   From previous records: -stomach problems "all my life". Dx with IBS with alternating diarrhea and constipation several years ago.  Associated abdominal bloating and crampy abdominal pain.  Also has nausea.  No heartburn as long as she is on Nexium.  " Having pain all over the body"  Had extensive work-up in the past as described below.  The GI symptoms get worse with stress and anxiety.   Does admit that she has not been sleeping well.  Wt Readings from Last 3 Encounters:  10/26/21 215 lb 6 oz (97.7 kg)  06/22/21 211 lb (95.7 kg)  06/19/21 211 lb 4 oz (95.8 kg)    Previous GI work-up: CT Abdo/pelvis with p.o. and IV contrast May 16, 2021 -Hepatomegaly with mild diffuse hepatic steatosis. -Wall thickening hepatic flexure. -Colonic diverticulosis without diverticulitis -Aortic atherosclerosis  Colonoscopy 06/2021 - Two 4 to 6 mm polyps in the distal sigmoid colon and in the mid descending colon, removed  with a cold snare. Resected and retrieved. Bx- TA x 1. Rpt in 7 yrs. - Minimal sigmoid diverticulosis. - Non-bleeding internal hemorrhoids.   _0 : MRCP 09/2017: Hepatomegaly, s/p cholecystectomy.  Mildly prominent CBD 8 mm.  No choledocholithiasis.  Normal PD.  EGD 12/2019 -Moderate gastritis. -Otherwise normal EGD. -S/P empiric esophageal dilatation.  EGD with small bowel biopsies 06/04/2012: neg except for erosive esophagitis LA gd A, neg SB  bx, neg CLO. Colonoscopy 06/04/2012: Normal except for small hyperplastic polyps, neg random colonic bx. Hydrogen breath test for SIBO-results awaited. Meckel scan 09/2017: neg [place] 03/2017: CT 2016: neg per notes, report awaited.  Addendum: Got few records 10/13/2019 Hb 11.7, MCV 86, plt 272, WBC count 10 K, B12 169 (being replaced). Nl LFTs except for alk phos 170. Nl BUN/creatinine 15/0.78.Marland Kitchen TSH 4.3.  Urine tox screen positive for opioids.  Hemoglobin A1c 7.1 Past Medical History:  Diagnosis Date   Allergic rhinitis    Anxiety disorder    Arthritis    Chronic fatigue    Chronic pain disorder    Cramp and spasm    Essential (primary) hypertension    Fibromyalgia    Gout    Heart rate fast    per patient happens during anxiety    Hyperlipemia, mixed    Hypothyroidism    IBS (irritable bowel syndrome)    Indigestion    Insomnia    Low back pain    Nontoxic single thyroid nodule    Other vitamin B12 deficiency anemias    Pneumonia    PONV (postoperative nausea and vomiting)    per patient gets a N/V and headache upon waking up from anesthesia   Type 2 diabetes mellitus ([place])    Unspecified menopausal and perimenopausal disorder    Vitamin D deficiency     Past Surgical History:  Procedure Laterality Date   ANKLE SURGERY     x5   BACK SURGERY     2nd back surgery , 1st - 9 /2019, 2nd - 05/25/20   BLADDER SURGERY  1999   bladder tack   CESAREAN SECTION     x3   CHOLECYSTECTOMY     COLONOSCOPY WITH ESOPHAGOGASTRODUODENOSCOPY (EGD)  05/2012   LUMBAR FUSION  09/2018   SHOULDER SURGERY  2011   TOTAL ABDOMINAL HYSTERECTOMY      Family History  Problem Relation Age of Onset   Diabetes Mother    Colon cancer Neg Hx    Pancreatic cancer Neg Hx    Stomach cancer Neg Hx    Esophageal cancer Neg Hx    Liver disease Neg Hx     Social History   Tobacco Use   Smoking status: Former    Pack years: 0.00    Types: Cigarettes    Quit date: 1994    Years since quitting:  28.5   Smokeless tobacco: Never  Vaping Use   Vaping Use: Never used  Substance Use Topics   Alcohol use: Never   Drug use: Never    Current Outpatient Medications  Medication Sig Dispense Refill   albuterol (VENTOLIN HFA) 108 (90 Base) MCG/ACT inhaler Inhale 1-2 puffs into the lungs every 6 (six) hours as needed for wheezing or shortness of breath.      allopurinol (ZYLOPRIM) 300 MG tablet Take 300 mg by mouth daily.     ALPRAZolam (XANAX) 0.5 MG tablet Take 0.5 mg by mouth as needed for anxiety.     busPIRone (BUSPAR) 10 MG tablet Take 10 mg  by mouth 2 (two) times daily.     cloNIDine (CATAPRES) 0.1 MG tablet Take 0.1 mg by mouth 3 (three) times daily.      dicyclomine (BENTYL) 10 MG capsule Take 1 capsule (10 mg total) by mouth 3 (three) times daily before meals. (Patient taking differently: Take 10 mg by mouth 4 (four) times daily.) 120 capsule 0   esomeprazole (NEXIUM) 40 MG capsule Take 1 capsule (40 mg total) by mouth daily at 12 noon. 30 capsule 6   fluticasone (CUTIVATE) 0.05 % cream Apply per rectum twice a day for 10 days 30 g 2   glipiZIDE (GLUCOTROL XL) 2.5 MG 24 hr tablet Take 2.5 mg by mouth in the morning and at bedtime.     labetalol (NORMODYNE) 100 MG tablet Take 100 mg by mouth 2 (two) times daily.     levocetirizine (XYZAL) 5 MG tablet Take 5 mg by mouth every evening.     levothyroxine (SYNTHROID) 100 MCG tablet Take 100 mcg by mouth daily before breakfast.     losartan (COZAAR) 50 MG tablet Take 50 mg by mouth in the morning and at bedtime.      metFORMIN (GLUCOPHAGE-XR) 500 MG 24 hr tablet Take 500 mg by mouth in the morning and at bedtime.     oxyCODONE-acetaminophen (PERCOCET) 10-325 MG tablet Take 1 tablet by mouth every 4 (four) hours as needed for pain. 60 tablet 0   traZODone (DESYREL) 100 MG tablet Take 100 mg by mouth at bedtime.     No current facility-administered medications for this visit.    Allergies  Allergen Reactions   Sulfa Antibiotics  Shortness Of Breath   Sulfamethoxazole Anaphylaxis   Duloxetine Other (See Comments)    nervous    Gabapentin Swelling   Nsaids Other (See Comments)    Stomach burns    Oseltamivir Swelling    Tongue swelling    Prednisone    Pregabalin Swelling    Tongue swells     Review of Systems:  Has fatigue Has anxiety/depression Problems sleeping-being addressed by Dr.Haque     Physical Exam:    BP 138/90   Pulse 76   Ht _0  (1.651 m)   Wt 211 lb 4 oz (95.8 kg)   SpO2 99%   BMI 35.15 kg/m  Wt Readings from Last 3 Encounters:  06/19/21 211 lb 4 oz (95.8 kg)  05/26/20 222 lb (100.7 kg)  05/24/20 220 lb 8 oz (100 kg)  Gen: awake, alert, NAD HEENT: anicteric, no pallor CV: RRR, no mrg Pulm: CTA b/l Abd: soft, generalized abdominal tenderness/abdominal wall tenderness, +BS throughout Ext: no c/c/e Neuro: nonfocal      Carmell Austria, MD 06/19/2021, 9:46 AM  Cc: Zoila Shutter, NP

## 2021-10-27 LAB — CBC WITH DIFFERENTIAL/PLATELET
Absolute Monocytes: 644 cells/uL (ref 200–950)
Basophils Absolute: 56 cells/uL (ref 0–200)
Basophils Relative: 0.5 %
Eosinophils Absolute: 311 cells/uL (ref 15–500)
Eosinophils Relative: 2.8 %
HCT: 31.8 % — ABNORMAL LOW (ref 35.0–45.0)
Hemoglobin: 10.3 g/dL — ABNORMAL LOW (ref 11.7–15.5)
Lymphs Abs: 3352 cells/uL (ref 850–3900)
MCH: 27.2 pg (ref 27.0–33.0)
MCHC: 32.4 g/dL (ref 32.0–36.0)
MCV: 84.1 fL (ref 80.0–100.0)
MPV: 10.4 fL (ref 7.5–12.5)
Monocytes Relative: 5.8 %
Neutro Abs: 6738 cells/uL (ref 1500–7800)
Neutrophils Relative %: 60.7 %
Platelets: 242 10*3/uL (ref 140–400)
RBC: 3.78 10*6/uL — ABNORMAL LOW (ref 3.80–5.10)
RDW: 13.9 % (ref 11.0–15.0)
Total Lymphocyte: 30.2 %
WBC: 11.1 10*3/uL — ABNORMAL HIGH (ref 3.8–10.8)

## 2021-10-27 LAB — HCV INTERPRETATION

## 2021-10-27 LAB — COMPREHENSIVE METABOLIC PANEL
AG Ratio: 1.6 (calc) (ref 1.0–2.5)
ALT: 12 U/L (ref 6–29)
AST: 13 U/L (ref 10–35)
Albumin: 4.5 g/dL (ref 3.6–5.1)
Alkaline phosphatase (APISO): 135 U/L (ref 37–153)
BUN/Creatinine Ratio: 17 (calc) (ref 6–22)
BUN: 20 mg/dL (ref 7–25)
CO2: 28 mmol/L (ref 20–32)
Calcium: 10.4 mg/dL (ref 8.6–10.4)
Chloride: 101 mmol/L (ref 98–110)
Creat: 1.17 mg/dL — ABNORMAL HIGH (ref 0.50–1.05)
Globulin: 2.8 g/dL (calc) (ref 1.9–3.7)
Glucose, Bld: 81 mg/dL (ref 65–99)
Potassium: 4.4 mmol/L (ref 3.5–5.3)
Sodium: 138 mmol/L (ref 135–146)
Total Bilirubin: 0.4 mg/dL (ref 0.2–1.2)
Total Protein: 7.3 g/dL (ref 6.1–8.1)

## 2021-10-27 LAB — ACUTE VIRAL HEPATITIS (HAV, HBV, HCV)
HCV Ab: <0.1 s/co ratio (ref 0.0–0.9)
Hep A IgM: NEGATIVE
Hep B C IgM: NEGATIVE
Hepatitis B Surface Ag: NEGATIVE

## 2021-10-27 LAB — GAMMA GT: GGT: 33 U/L (ref 3–65)

## 2021-10-31 LAB — MITOCHONDRIAL ANTIBODIES: Mitochondrial M2 Ab, IgG: <=20 U (ref <=20.0)

## 2021-10-31 LAB — HEPATITIS A ANTIBODY, TOTAL: Hepatitis A AB,Total: NONREACTIVE

## 2021-10-31 LAB — ANTI-SMOOTH MUSCLE ANTIBODY, IGG: Actin (Smooth Muscle) Antibody (IGG): <20 U (ref <20)

## 2021-10-31 LAB — HEPATITIS B SURFACE ANTIBODY,QUALITATIVE: Hep B S Ab: NONREACTIVE

## 2021-10-31 LAB — ALPHA-1-ANTITRYPSIN: A-1 Antitrypsin, Ser: 135 mg/dL (ref 83–199)

## 2021-11-06 ENCOUNTER — Other Ambulatory Visit: Payer: Self-pay

## 2021-11-06 ENCOUNTER — Ambulatory Visit (HOSPITAL_BASED_OUTPATIENT_CLINIC_OR_DEPARTMENT_OTHER)
Admission: RE | Admit: 2021-11-06 | Discharge: 2021-11-06 | Disposition: A | Discharge status: Home / Self Care | Payer: Medicare Other | Source: Ambulatory Visit | Attending: Gastroenterology | Admitting: Gastroenterology

## 2021-11-06 DIAGNOSIS — K582 Mixed irritable bowel syndrome: Secondary | ICD-10-CM | POA: Diagnosis present

## 2021-11-06 DIAGNOSIS — K2100 Gastro-esophageal reflux disease with esophagitis, without bleeding: Secondary | ICD-10-CM | POA: Diagnosis present

## 2021-11-06 DIAGNOSIS — K760 Fatty (change of) liver, not elsewhere classified: Secondary | ICD-10-CM | POA: Insufficient documentation

## 2021-11-06 DIAGNOSIS — R1011 Right upper quadrant pain: Secondary | ICD-10-CM | POA: Diagnosis present

## 2021-11-07 ENCOUNTER — Other Ambulatory Visit: Payer: Self-pay

## 2021-11-07 DIAGNOSIS — Z23 Encounter for immunization: Secondary | ICD-10-CM

## 2021-11-11 NOTE — Progress Notes (Signed)
Please inform the patient. Still having right upper quadrant abdominal pain.  The LFTs were normal. Due to abnormal ultrasound, lets proceed with MRCP (no need for IV contrast) Send report to family physician  RG

## 2021-11-12 ENCOUNTER — Other Ambulatory Visit: Payer: Self-pay

## 2021-11-12 DIAGNOSIS — R1011 Right upper quadrant pain: Secondary | ICD-10-CM

## 2021-11-12 DIAGNOSIS — R935 Abnormal findings on diagnostic imaging of other abdominal regions, including retroperitoneum: Secondary | ICD-10-CM

## 2021-11-22 ENCOUNTER — Ambulatory Visit (HOSPITAL_COMMUNITY): Admission: RE | Admit: 2021-11-22 | Payer: Medicare Other | Source: Ambulatory Visit

## 2021-11-22 ENCOUNTER — Other Ambulatory Visit: Payer: Self-pay | Admitting: Gastroenterology

## 2021-11-22 DIAGNOSIS — R1011 Right upper quadrant pain: Secondary | ICD-10-CM

## 2021-11-22 DIAGNOSIS — R935 Abnormal findings on diagnostic imaging of other abdominal regions, including retroperitoneum: Secondary | ICD-10-CM

## 2021-11-26 ENCOUNTER — Ambulatory Visit (HOSPITAL_COMMUNITY)
Admission: RE | Admit: 2021-11-26 | Discharge: 2021-11-26 | Disposition: A | Discharge status: Home / Self Care | Payer: Medicare Other | Source: Ambulatory Visit | Attending: Gastroenterology | Admitting: Gastroenterology

## 2021-11-26 DIAGNOSIS — R935 Abnormal findings on diagnostic imaging of other abdominal regions, including retroperitoneum: Secondary | ICD-10-CM | POA: Insufficient documentation

## 2021-11-26 DIAGNOSIS — R1011 Right upper quadrant pain: Secondary | ICD-10-CM | POA: Diagnosis not present

## 2021-11-26 MED ORDER — GADOBUTROL 1 MMOL/ML IV SOLN
10.0000 mL | Freq: Once | INTRAVENOUS | Status: AC | PRN
  Administered 2021-11-26: 10 mL via INTRAVENOUS

## 2021-11-30 ENCOUNTER — Telehealth: Payer: Self-pay | Admitting: Gastroenterology

## 2021-11-30 NOTE — Telephone Encounter (Signed)
Pt calling in regard to recent imaging that was done. Pt notified that the provider has not reviewed the results yet. Pt was encouraged to callback the latter part of next week if we have not contacted her prior. Pt verbalized understanding with all questions answered.

## 2021-11-30 NOTE — Telephone Encounter (Signed)
Left message for pt to call back  °

## 2021-12-05 ENCOUNTER — Telehealth: Payer: Self-pay | Admitting: Gastroenterology

## 2021-12-05 NOTE — Telephone Encounter (Signed)
Patient is scheduled for hep injection tomorrow.  She wants to know with her enlarged liver if this is necessary.  Please call and advise.

## 2021-12-05 NOTE — Telephone Encounter (Signed)
Pt was questioning if she should have the Hepatis vaccines that are scheduled stating that she has an "enlarged Liver" After reviewing pt chart it is noted that pt has a fatty Liver. Pt was made aware. Pt notified to keep appointment  Pt verbalized understanding with all questions answered.

## 2021-12-13 ENCOUNTER — Other Ambulatory Visit: Payer: Self-pay

## 2021-12-13 ENCOUNTER — Ambulatory Visit (INDEPENDENT_AMBULATORY_CARE_PROVIDER_SITE_OTHER): Payer: Medicare Other | Admitting: Gastroenterology

## 2021-12-13 DIAGNOSIS — Z23 Encounter for immunization: Secondary | ICD-10-CM | POA: Diagnosis not present

## 2021-12-13 NOTE — Progress Notes (Signed)
Patient given Hep A and Heplasav- B IM injections. Patient waited 15 minutes post injection and left the office ambulatory. Patient instructed to contact the office or seek medical attention if any problems arise.

## 2022-01-14 ENCOUNTER — Telehealth: Payer: Self-pay | Admitting: Gastroenterology

## 2022-01-14 NOTE — Telephone Encounter (Signed)
Patient is sick with COVID and is supposed to come in today at 1:30 for a Hep Injection. Is wanting to reschedule but wants to make sure its okay to get her second shot if its after the 30 days. Please advise.

## 2022-01-15 NOTE — Telephone Encounter (Signed)
No problems at all Can come in for second shot when she recovers RG

## 2022-01-28 ENCOUNTER — Other Ambulatory Visit: Payer: Self-pay

## 2022-01-28 ENCOUNTER — Ambulatory Visit (INDEPENDENT_AMBULATORY_CARE_PROVIDER_SITE_OTHER): Payer: Medicare Other | Admitting: Gastroenterology

## 2022-01-28 DIAGNOSIS — Z23 Encounter for immunization: Secondary | ICD-10-CM

## 2022-01-28 NOTE — Progress Notes (Signed)
patient given # 2 heplisav B injection in left deltoid. Patient waited 15 minutes post injection period. Tolerated injection well and left the clinic ambulatory

## 2022-02-26 ENCOUNTER — Ambulatory Visit (INDEPENDENT_AMBULATORY_CARE_PROVIDER_SITE_OTHER): Payer: Medicare Other | Admitting: Gastroenterology

## 2022-02-26 ENCOUNTER — Encounter: Payer: Self-pay | Admitting: Gastroenterology

## 2022-02-26 ENCOUNTER — Other Ambulatory Visit: Payer: Self-pay

## 2022-02-26 VITALS — BP 160/90 | HR 91 | Ht 65.0 in | Wt 218.0 lb

## 2022-02-26 DIAGNOSIS — G8929 Other chronic pain: Secondary | ICD-10-CM | POA: Diagnosis not present

## 2022-02-26 DIAGNOSIS — K219 Gastro-esophageal reflux disease without esophagitis: Secondary | ICD-10-CM

## 2022-02-26 DIAGNOSIS — R1011 Right upper quadrant pain: Secondary | ICD-10-CM

## 2022-02-26 DIAGNOSIS — K582 Mixed irritable bowel syndrome: Secondary | ICD-10-CM

## 2022-02-26 MED ORDER — DICYCLOMINE HCL 20 MG PO TABS: 20.0000 mg | ORAL_TABLET | Freq: Four times a day (QID) | ORAL | 2 refills | Status: DC

## 2022-02-26 MED ORDER — PANTOPRAZOLE SODIUM 40 MG PO TBEC: 40.0000 mg | DELAYED_RELEASE_TABLET | Freq: Every day | ORAL | 11 refills | Status: AC

## 2022-02-26 NOTE — Patient Instructions (Signed)
If you are age 64 or older, your body mass index should be between 23-30. Your Body mass index is 36.28 kg/m?Christy Long If this is out of the aforementioned range listed, please consider follow up with your Primary Care Provider. ? ?If you are age 57 or younger, your body mass index should be between 19-25. Your Body mass index is 36.28 kg/m?Christy Long If this is out of the aformentioned range listed, please consider follow up with your Primary Care Provider.  ? ?________________________________________________________ ? ?The [facility] providers would like to encourage you to use [facility] to communicate with providers for non-urgent requests or questions.  Due to long hold times on the telephone, sending your provider a message by [facility] may be a faster and more efficient way to get a response.  Please allow 48 business hours for a response.  Please remember that this is for non-urgent requests.  ?_______________________________________________________ ? ?We have sent the following medications to your pharmacy for you to pick up at your convenience: ?Protonix and Bentyl ? ?Stop Nexium. Minimize pain medication ? ?Please call in 3 month to schedule follow up appointment. ? ? ?Thank you, ? ?Dr. Jackquline Denmark ? ?

## 2022-02-26 NOTE — Progress Notes (Signed)
Chief Complaint: FU  Referring Provider:  Zoila Shutter, NP      ASSESSMENT AND PLAN;   #1. Chronic RUQ with neg WU as below. Likely combination of musculoskeletal pain related to fibromyalgia and fatty liver. S/P lap chole.  Neg extensive WU including EGD with SB bx and GES 12/2019, colon 06/2021, MRCP 2018, Meckel's scan 2018, [place], Cts as below.  H2 breath test suspicious for SIBO 02/2020, Rxed with rifaximin without benefit. S/P lap chole  #2.  GERD with intermittent eso dysphagia (resolved).  #3.  IBS with alternating diarrhea and constipation. Neg colon with random Bx 05/2012, 06/2021. SIBO Aprl 2, 2021, Rxed with rifaxamin  #4.  Fatty liver with abn alk phos. Neg MRCP 2018. S/p cholecystectomy.  #5.  H/O IDA/B12 def (Hb 10.9 02/2020)  #6.  Comorbid conditions include obesity, anxiety/depression, chronic pain syndrome on narcotics, HTN, HLD, DM2, hypothyroidism, gout, chronic fatigue syndrome, fibromyalgia.   Plan:   -Change nexium to protonix 3m po qd #30 -Bentyl 258mpo qid prn #120, 2 refills. -Minimize pain medications. -Solid phase GES if still with problems. -FU in 12 weeks.    HPI:    Christy CATANESEs a [age].o. female   She also had continued RUQ pain.   C/O chronic RUQ abdo pain x several years which used to be intermittent and now is more constant.  Neg EGD Jan 2021 with neg eso and SB Bx.  Thereafter had Nl solid-phase GES 01/2020.  She also underwent empiric eso dil at the time of last endoscopy with resolution of dysphagia. SIBO breath test 02/2020 was suspicious for bacterial overgrowth.  She underwent a trial of rifaximin 550 mg TID for 2 weeks without any significant relief.  No jaundice dark urine or pale stools.  Note that she is s/p cholecystectomy.  No H/O itching, skin lesions, easy bruisability, intake of OTC meds including diet pills, herbal medications, anabolic steroids or Tylenol. There is no H/O blood transfusions, IVDA or FH of liver  disease. No jaundice, dark urine or pale stools. No alcohol abuse.  She continues to have alternating diarrhea and constipation with abdominal bloating.  Had neg recent colonoscopy as below.   From previous records: -stomach problems "all my life". Dx with IBS with alternating diarrhea and constipation several years ago.  Associated abdominal bloating and crampy abdominal pain.  Also has nausea.  No heartburn as long as she is on Nexium.  " Having pain all over the body"  Had extensive work-up in the past as described below.  The GI symptoms get worse with stress and anxiety.   Does admit that she has not been sleeping well.  Wt Readings from Last 3 Encounters:  02/26/22 218 lb (98.9 kg)  10/26/21 215 lb 6 oz (97.7 kg)  06/22/21 211 lb (95.7 kg)    Previous GI work-up:  MRCP 1. Hepatomegaly with hepatic steatosis. 2. Status post cholecystectomy. There is chronic dilatation of the common bile duct without evidence of significant intrahepatic biliary ductal dilatation, presumably reflective of benign post cholecystectomy physiology. No choledocholithiasis or other evidence to suggest biliary tract obstruction.  Colon    CT Abdo/pelvis with p.o. and IV contrast May 16, 2021 -Hepatomegaly with mild diffuse hepatic steatosis. -Wall thickening hepatic flexure. -Colonic diverticulosis without diverticulitis -Aortic atherosclerosis  Colonoscopy 06/2021 - Two 4 to 6 mm polyps in the distal sigmoid colon and in the mid descending colon, removed with a cold snare. Resected and retrieved. Bx- TA  x 1. Rpt in 7 yrs. - Minimal sigmoid diverticulosis. - Non-bleeding internal hemorrhoids.   _0 : MRCP 09/2017: Hepatomegaly, s/p cholecystectomy.  Mildly prominent CBD 8 mm.  No choledocholithiasis.  Normal PD.  EGD 12/2019 -Moderate gastritis. -Otherwise normal EGD. -S/P empiric esophageal dilatation.  EGD with small bowel biopsies 06/04/2012: neg except for erosive esophagitis  LA gd A, neg SB bx, neg CLO. Colonoscopy 06/04/2012: Normal except for small hyperplastic polyps, neg random colonic bx. Hydrogen breath test for SIBO-results awaited. Meckel scan 09/2017: neg [place] 03/2017: CT 2016: neg per notes, report awaited.  Addendum: Got few records 10/13/2019 Hb 11.7, MCV 86, plt 272, WBC count 10 K, B12 169 (being replaced). Nl LFTs except for alk phos 170. Nl BUN/creatinine 15/0.78.Marland Kitchen TSH 4.3.  Urine tox screen positive for opioids.  Hemoglobin A1c 7.1 Past Medical History:  Diagnosis Date   Allergic rhinitis    Anxiety disorder    Arthritis    Chronic fatigue    Chronic pain disorder    Cramp and spasm    Essential (primary) hypertension    Fibromyalgia    Gout    Heart rate fast    per patient happens during anxiety    Hyperlipemia, mixed    Hypothyroidism    IBS (irritable bowel syndrome)    Indigestion    Insomnia    Low back pain    Nontoxic single thyroid nodule    Other vitamin B12 deficiency anemias    Pneumonia    PONV (postoperative nausea and vomiting)    per patient gets a N/V and headache upon waking up from anesthesia   Type 2 diabetes mellitus ([place])    Unspecified menopausal and perimenopausal disorder    Vitamin D deficiency     Past Surgical History:  Procedure Laterality Date   ANKLE SURGERY     x5   BACK SURGERY     2nd back surgery , 1st - 9 /2019, 2nd - 05/25/20   BLADDER SURGERY  1999   bladder tack   CESAREAN SECTION     x3   CHOLECYSTECTOMY     COLONOSCOPY WITH ESOPHAGOGASTRODUODENOSCOPY (EGD)  05/2012   LUMBAR FUSION  09/2018   SHOULDER SURGERY  2011   TOTAL ABDOMINAL HYSTERECTOMY      Family History  Problem Relation Age of Onset   Diabetes Mother    Colon cancer Neg Hx    Pancreatic cancer Neg Hx    Stomach cancer Neg Hx    Esophageal cancer Neg Hx    Liver disease Neg Hx     Social History   Tobacco Use   Smoking status: Former    Pack years: 0.00    Types: Cigarettes    Quit date: 1994    Years  since quitting: 28.5   Smokeless tobacco: Never  Vaping Use   Vaping Use: Never used  Substance Use Topics   Alcohol use: Never   Drug use: Never    Current Outpatient Medications  Medication Sig Dispense Refill   albuterol (VENTOLIN HFA) 108 (90 Base) MCG/ACT inhaler Inhale 1-2 puffs into the lungs every 6 (six) hours as needed for wheezing or shortness of breath.      allopurinol (ZYLOPRIM) 300 MG tablet Take 300 mg by mouth daily.     ALPRAZolam (XANAX) 0.5 MG tablet Take 0.5 mg by mouth as needed for anxiety.     busPIRone (BUSPAR) 10 MG tablet Take 10 mg by mouth 2 (two) times daily.  cloNIDine (CATAPRES) 0.1 MG tablet Take 0.1 mg by mouth 3 (three) times daily.      dicyclomine (BENTYL) 10 MG capsule Take 1 capsule (10 mg total) by mouth 3 (three) times daily before meals. (Patient taking differently: Take 10 mg by mouth 4 (four) times daily.) 120 capsule 0   esomeprazole (NEXIUM) 40 MG capsule Take 1 capsule (40 mg total) by mouth daily at 12 noon. 30 capsule 6   fluticasone (CUTIVATE) 0.05 % cream Apply per rectum twice a day for 10 days 30 g 2   glipiZIDE (GLUCOTROL XL) 2.5 MG 24 hr tablet Take 2.5 mg by mouth in the morning and at bedtime.     labetalol (NORMODYNE) 100 MG tablet Take 100 mg by mouth 2 (two) times daily.     levocetirizine (XYZAL) 5 MG tablet Take 5 mg by mouth every evening.     levothyroxine (SYNTHROID) 100 MCG tablet Take 100 mcg by mouth daily before breakfast.     losartan (COZAAR) 50 MG tablet Take 50 mg by mouth in the morning and at bedtime.      metFORMIN (GLUCOPHAGE-XR) 500 MG 24 hr tablet Take 500 mg by mouth in the morning and at bedtime.     oxyCODONE-acetaminophen (PERCOCET) 10-325 MG tablet Take 1 tablet by mouth every 4 (four) hours as needed for pain. 60 tablet 0   traZODone (DESYREL) 100 MG tablet Take 100 mg by mouth at bedtime.     No current facility-administered medications for this visit.    Allergies  Allergen Reactions   Sulfa  Antibiotics Shortness Of Breath   Sulfamethoxazole Anaphylaxis   Duloxetine Other (See Comments)    nervous    Gabapentin Swelling   Nsaids Other (See Comments)    Stomach burns    Oseltamivir Swelling    Tongue swelling    Prednisone    Pregabalin Swelling    Tongue swells     Review of Systems:  Has fatigue Has anxiety/depression Problems sleeping-being addressed by Dr.Haque     Physical Exam:    BP 138/90    Pulse 76    Ht _0  (1.651 m)    Wt 211 lb 4 oz (95.8 kg)    SpO2 99%    BMI 35.15 kg/m  Wt Readings from Last 3 Encounters:  06/19/21 211 lb 4 oz (95.8 kg)  05/26/20 222 lb (100.7 kg)  05/24/20 220 lb 8 oz (100 kg)  Gen: awake, alert, NAD HEENT: anicteric, no pallor CV: RRR, no mrg Pulm: CTA b/l Abd: soft, generalized abdominal tenderness/abdominal wall tenderness, +BS throughout Ext: no c/c/e Neuro: nonfocal      Carmell Austria, MD 06/19/2021, 9:46 AM  Cc: Zoila Shutter, NP

## 2022-07-05 ENCOUNTER — Ambulatory Visit (INDEPENDENT_AMBULATORY_CARE_PROVIDER_SITE_OTHER): Payer: Medicare Other | Admitting: Gastroenterology

## 2022-07-05 ENCOUNTER — Other Ambulatory Visit: Payer: Self-pay

## 2022-07-05 DIAGNOSIS — Z23 Encounter for immunization: Secondary | ICD-10-CM

## 2022-09-19 ENCOUNTER — Other Ambulatory Visit: Payer: Self-pay | Admitting: Gastroenterology

## 2022-12-23 HISTORY — PX: CERVICAL SPINE SURGERY: SHX589

## 2023-05-12 ENCOUNTER — Other Ambulatory Visit: Payer: Self-pay | Admitting: Gastroenterology

## 2023-06-05 IMAGING — MR MR MRCP
18 of 20 series · 41 of 48 positions shown · IV contrast (GADAVIST)
Comparison: No prior abdominal MRI. Right upper quad scratch the
abdominal ultrasound 11/06/2021.

CLINICAL DATA: 65-year-old female with history of right upper
quadrant abdominal pain. Abnormal right upper quadrant ultrasound.

EXAM:
MRI ABDOMEN WITHOUT CONTRAST  (INCLUDING MRCP)
TECHNIQUE: Multiplanar multisequence MR imaging of the abdomen was performed.
Heavily T2-weighted images of the biliary and pancreatic ducts were
obtained, and three-dimensional MRCP images were rendered by post
processing.

[Series 3: T2 fat-sat · axial · 6.5mm · 1.25mm/px · 1 of 36 slices shown]
[im 1/36]
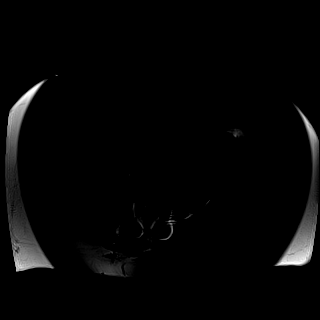

[Series 7: DWI · axial · 6.0mm · 1.49mm/px · z∈[-177,+117]mm · 3 of 72 slices shown (1 of 2)]
[im 1/72]
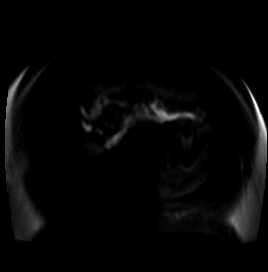
[im 36/72]
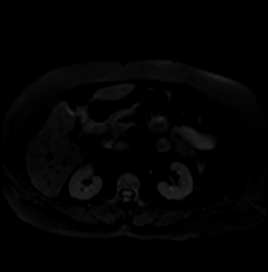
[im 72/72]
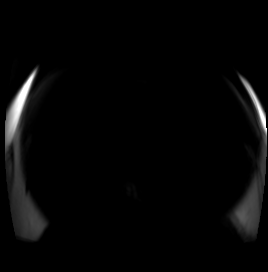

[Series 8: DWI · axial · 6.0mm · 1.49mm/px · 1 of 36 slices shown (2 of 2)]
[im 1/36]
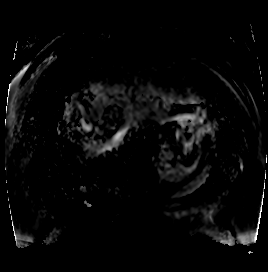

[Series 9: T1 · axial · 4.0mm · 1.34mm/px · z∈[-190,+126]mm · 3 of 80 slices shown (1 of 2)]
[im 1/80]
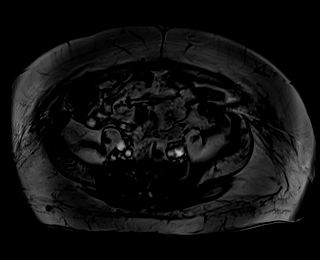
[im 40/80]
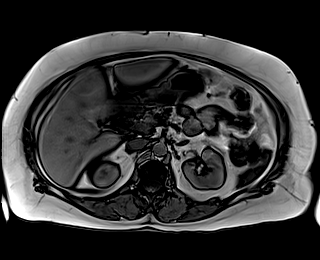
[im 80/80]
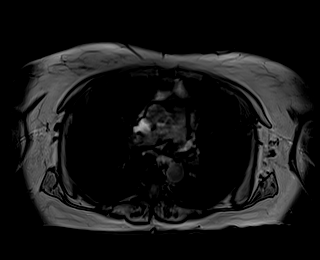

[Series 10: T1 · axial · 4.0mm · 1.34mm/px · z∈[-190,+126]mm · 3 of 80 slices shown (2 of 2)]
[im 1/80]
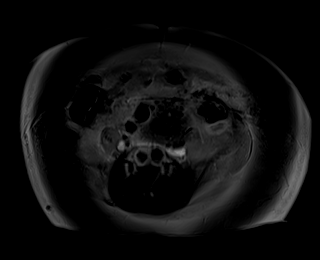
[im 40/80]
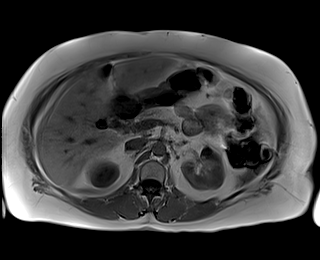
[im 80/80]
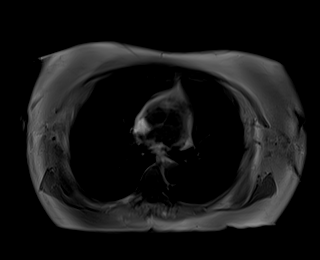

[Series 11: T2 · coronal · 6.0mm · 1.56mm/px · 1 of 30 slices shown (1 of 2)]
[im 1/30]
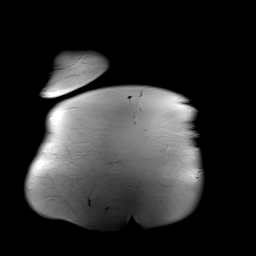

[Series 12: cor obl thk · sagittal · 50.0mm · 0.78mm/px · 1 of 9 slices shown]
[im 1/9]
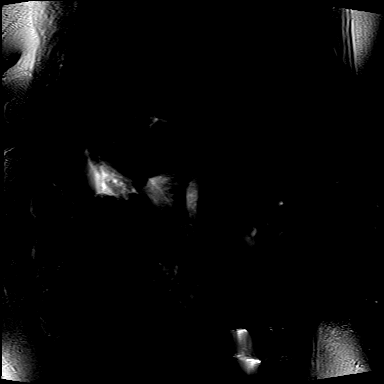

[Series 14: cor_3d_spc_trig · coronal · 1.1mm · 0.49mm/px · 2 of 47 slices shown]
[im 1/47]
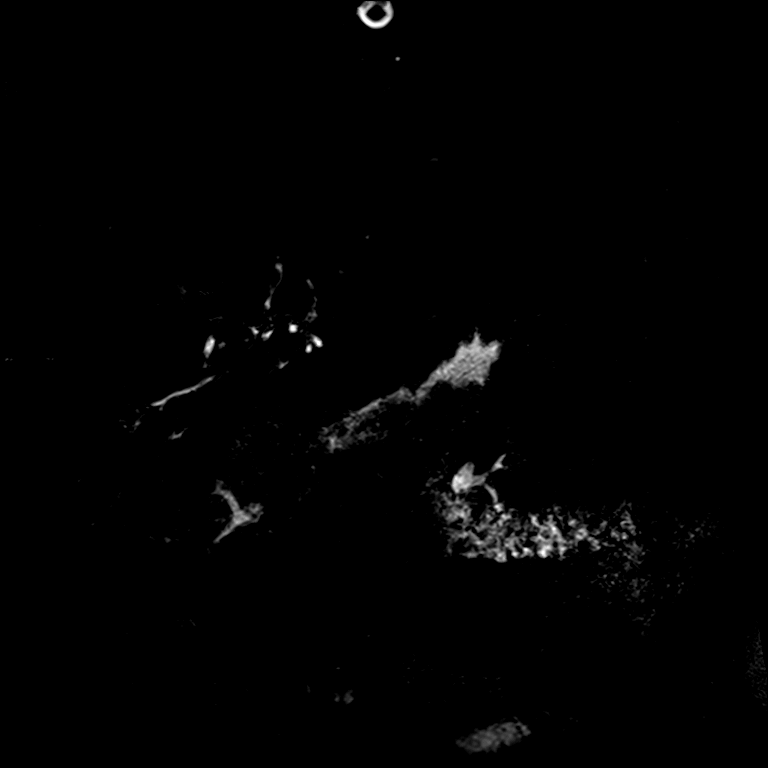
[im 47/47]
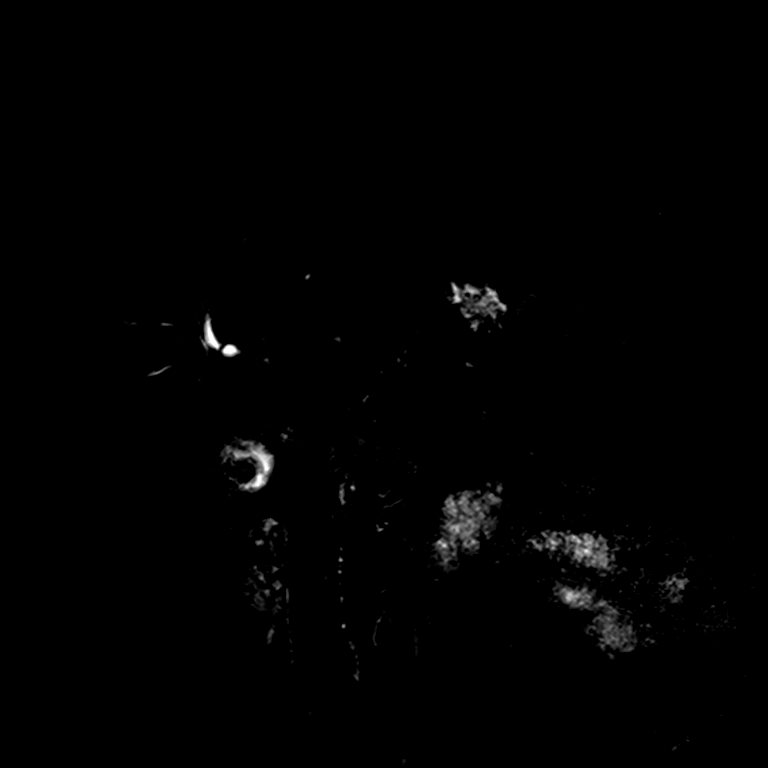

[Series 16: T2 · axial · 6.5mm · 1.64mm/px · 1 of 34 slices shown (2 of 2)]
[im 1/34]
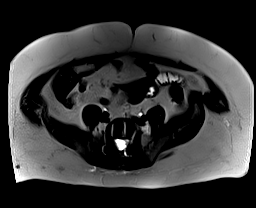

[Series 18: T1 dynamic · axial · 3.2mm · 1.38mm/px · z∈[-161,+117]mm · 3 of 88 slices shown (1 of 9)]
[im 1/88]
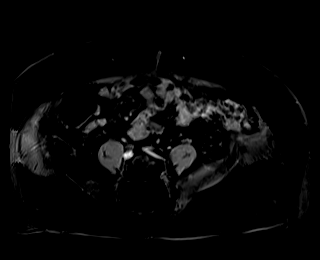
[im 44/88]
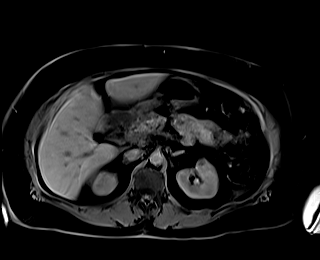
[im 88/88]
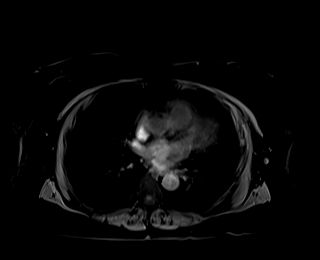

[Series 23: T1 dynamic · axial · 3.2mm · 1.38mm/px · z∈[-161,+117]mm · 3 of 88 slices shown (2 of 9)]
[im 1/88]
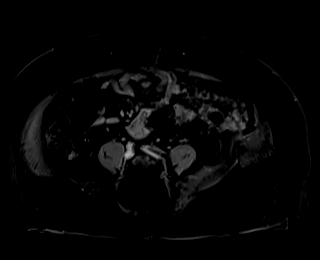
[im 44/88]
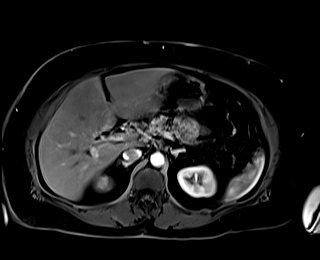
[im 88/88]
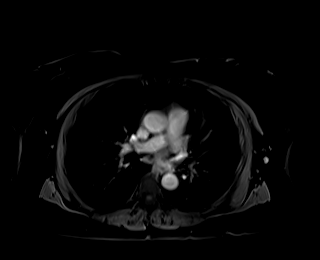

[Series 24: T1 dynamic · axial · 3.2mm · 1.38mm/px · z∈[-161,+117]mm · 3 of 88 slices shown (3 of 9)]
[im 1/88]
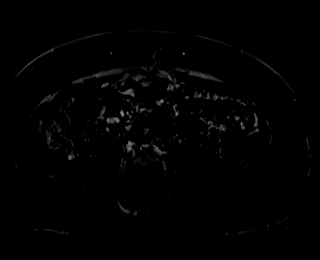
[im 44/88]
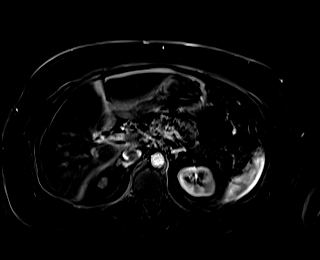
[im 88/88]
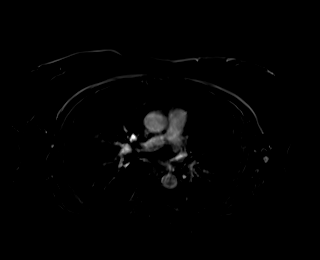

[Series 27: T1 dynamic · axial · 3.2mm · 1.38mm/px · z∈[-161,+117]mm · 3 of 88 slices shown (4 of 9)]
[im 1/88]
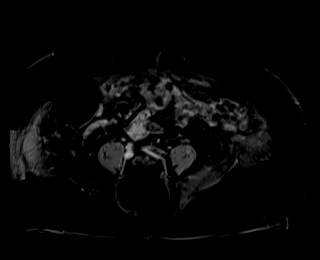
[im 44/88]
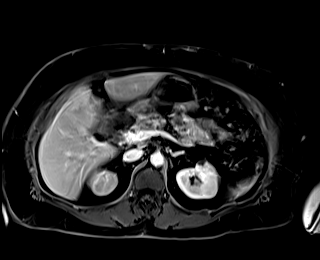
[im 88/88]
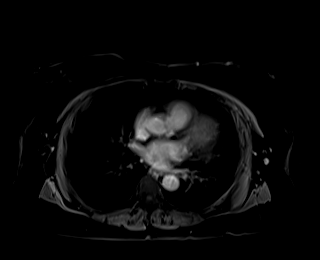

[Series 28: T1 dynamic · axial · 3.2mm · 1.38mm/px · z∈[-161,+117]mm · 3 of 88 slices shown (5 of 9)]
[im 1/88]
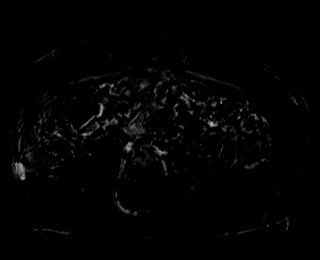
[im 44/88]
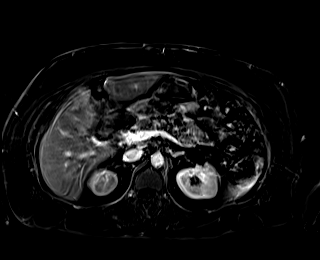
[im 88/88]
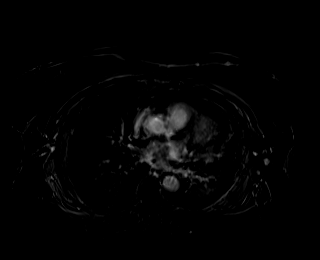

[Series 31: T1 dynamic · axial · 3.2mm · 1.38mm/px · z∈[-161,+117]mm · 3 of 88 slices shown (6 of 9)]
[im 1/88]
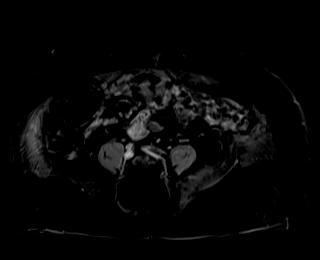
[im 44/88]
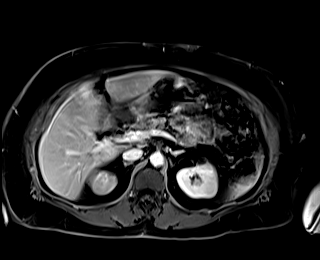
[im 88/88]
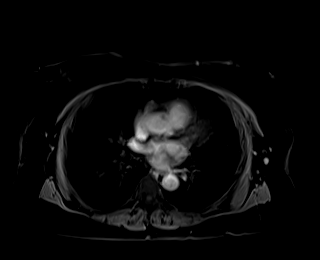

[Series 32: T1 dynamic · axial · 3.2mm · 1.38mm/px · z∈[-161,+117]mm · 3 of 88 slices shown (7 of 9)]
[im 1/88]
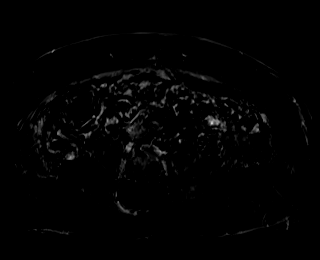
[im 44/88]
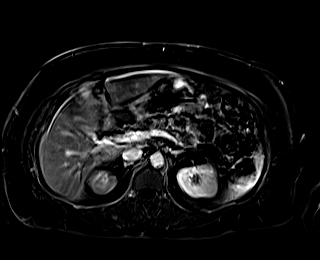
[im 88/88]
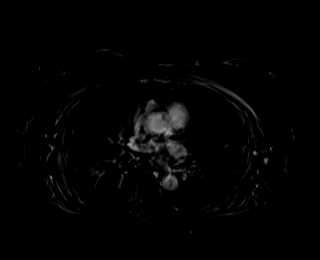

[Series 37: T1 dynamic · axial · 3.2mm · 1.38mm/px · z∈[-161,+117]mm · 3 of 88 slices shown (8 of 9)]
[im 1/88]
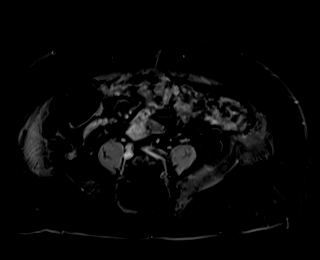
[im 44/88]
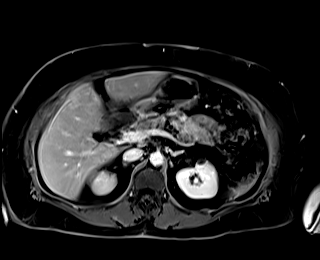
[im 88/88]
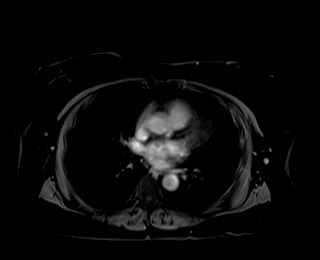

[Series 38: T1 dynamic · axial · 3.2mm · 1.38mm/px · 1 of 88 slices shown (9 of 9)]
[im 1/88]
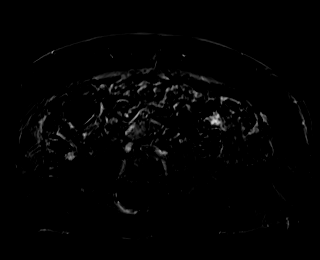

[41 of 48 positions shown; findings below may reference images not displayed]

FINDINGS: Comment: Portions of today's examination are limited by considerable
patient respiratory motion.

Lower chest: Unremarkable.

Hepatobiliary: Liver is enlarged measuring 21.7 cm in craniocaudal
span. Diffuse loss of signal intensity throughout the hepatic
parenchyma on out of phase dual echo images, indicative of a
background of hepatic steatosis. Within the limitations of today's
examination, there are no suspicious cystic or solid hepatic
lesions. MRCP images demonstrate no intrahepatic biliary ductal
dilatation. Common bile duct is dilated measuring up to 10 mm in the
porta hepatis. No filling defects in the common bile duct to suggest
the presence of choledocholithiasis. Patient is status post
cholecystectomy.

Pancreas: No pancreatic mass. No pancreatic ductal dilatation noted
on MRCP images. No pancreatic or peripancreatic fluid collections or
inflammatory changes.

Spleen:  Unremarkable.

Adrenals/Urinary Tract: Multiple small T1 hypointense, T2
hyperintense, nonenhancing lesions in the left kidney, compatible
with simple cysts, largest of which is exophytic in the upper pole
of the left kidney measuring 1.6 cm. Right kidney and bilateral
adrenal glands are normal in appearance. No hydroureteronephrosis in
the visualized portions of the abdomen.

Stomach/Bowel: Visualized portions are unremarkable.

Vascular/Lymphatic: No aneurysm identified in the visualized
abdominal vasculature. No lymphadenopathy noted in the abdomen.

Other: No significant volume of ascites noted in the visualized
portions of the peritoneal cavity.

Musculoskeletal: No aggressive appearing osseous lesions are noted
in the visualized portions of the skeleton. Susceptibility artifact
in the lower lumbar spine corresponding to indwelling spinal
hardware incidentally noted.
IMPRESSION: 1. Hepatomegaly with hepatic steatosis.
2. Status post cholecystectomy. There is chronic dilatation of the
common bile duct without evidence of significant intrahepatic
biliary ductal dilatation, presumably reflective of benign post
cholecystectomy physiology. No choledocholithiasis or other evidence
to suggest biliary tract obstruction.

## 2024-07-18 DIAGNOSIS — I959 Hypotension, unspecified: Secondary | ICD-10-CM | POA: Diagnosis not present

## 2024-08-03 ENCOUNTER — Encounter: Payer: Self-pay | Admitting: Internal Medicine

## 2024-08-11 ENCOUNTER — Encounter: Payer: Self-pay | Admitting: Internal Medicine

## 2024-10-15 ENCOUNTER — Other Ambulatory Visit: Payer: Self-pay | Admitting: Gastroenterology

## 2024-11-03 ENCOUNTER — Other Ambulatory Visit (INDEPENDENT_AMBULATORY_CARE_PROVIDER_SITE_OTHER)

## 2024-11-03 ENCOUNTER — Encounter: Payer: Self-pay | Admitting: Gastroenterology

## 2024-11-03 ENCOUNTER — Ambulatory Visit (INDEPENDENT_AMBULATORY_CARE_PROVIDER_SITE_OTHER): Admitting: Gastroenterology

## 2024-11-03 VITALS — BP 130/80 | HR 74 | Ht 65.0 in | Wt 229.0 lb

## 2024-11-03 DIAGNOSIS — R1011 Right upper quadrant pain: Secondary | ICD-10-CM | POA: Diagnosis not present

## 2024-11-03 DIAGNOSIS — K589 Irritable bowel syndrome without diarrhea: Secondary | ICD-10-CM

## 2024-11-03 DIAGNOSIS — K219 Gastro-esophageal reflux disease without esophagitis: Secondary | ICD-10-CM

## 2024-11-03 LAB — COMPREHENSIVE METABOLIC PANEL WITH GFR
ALT: 19 U/L (ref 0–35)
AST: 17 U/L (ref 0–37)
Albumin: 4.5 g/dL (ref 3.5–5.2)
Alkaline Phosphatase: 121 U/L — ABNORMAL HIGH (ref 39–117)
BUN: 24 mg/dL — ABNORMAL HIGH (ref 6–23)
CO2: 25 meq/L (ref 19–32)
Calcium: 9.9 mg/dL (ref 8.4–10.5)
Chloride: 100 meq/L (ref 96–112)
Creatinine, Ser: 0.98 mg/dL (ref 0.40–1.20)
GFR: 60.15 mL/min (ref >60.00)
Glucose, Bld: 159 mg/dL — ABNORMAL HIGH (ref 70–99)
Potassium: 4.9 meq/L (ref 3.5–5.1)
Sodium: 134 meq/L — ABNORMAL LOW (ref 135–145)
Total Bilirubin: 0.4 mg/dL (ref 0.2–1.2)
Total Protein: 7.7 g/dL (ref 6.0–8.3)

## 2024-11-03 LAB — CBC WITH DIFFERENTIAL/PLATELET
Basophils Absolute: 0.1 K/uL (ref 0.0–0.1)
Basophils Relative: 0.9 % (ref 0.0–3.0)
Eosinophils Absolute: 0.3 K/uL (ref 0.0–0.7)
Eosinophils Relative: 3.7 % (ref 0.0–5.0)
HCT: 30.8 % — ABNORMAL LOW (ref 36.0–46.0)
Hemoglobin: 10.2 g/dL — ABNORMAL LOW (ref 12.0–15.0)
Lymphocytes Relative: 32.9 % (ref 12.0–46.0)
Lymphs Abs: 2.6 K/uL (ref 0.7–4.0)
MCHC: 33.1 g/dL (ref 30.0–36.0)
MCV: 85.1 fl (ref 78.0–100.0)
Monocytes Absolute: 0.7 K/uL (ref 0.1–1.0)
Monocytes Relative: 9.2 % (ref 3.0–12.0)
Neutro Abs: 4.2 K/uL (ref 1.4–7.7)
Neutrophils Relative %: 53.3 % (ref 43.0–77.0)
Platelets: 253 K/uL (ref 150.0–400.0)
RBC: 3.62 Mil/uL — ABNORMAL LOW (ref 3.87–5.11)
RDW: 15.7 % — ABNORMAL HIGH (ref 11.5–15.5)
WBC: 7.8 K/uL (ref 4.0–10.5)

## 2024-11-03 LAB — VITAMIN B12: Vitamin B-12: 260 pg/mL (ref 211–911)

## 2024-11-03 LAB — LIPASE: Lipase: 35 U/L (ref 11.0–59.0)

## 2024-11-03 MED ORDER — DICYCLOMINE HCL 20 MG PO TABS: 20.0000 mg | ORAL_TABLET | Freq: Four times a day (QID) | ORAL | 2 refills | Status: AC | PRN

## 2024-11-03 NOTE — Progress Notes (Signed)
 Chief Complaint: FU  Referring Provider:  Zachary Lamar BRAVO, NP      ASSESSMENT AND PLAN;   #1. GERD with eso dysphagia  #2. Chronic RUQ with neg WU as below. Likely combination of musculoskeletal pain related to fibromyalgia and fatty liver. S/P lap chole.  Neg extensive WU including EGD with SB bx and GES 12/2019, colon 06/2021, MRCP 2018, Meckel's scan 2018, US , Cts as below.  H2 breath test suspicious for SIBO 02/2020, Rxed with rifaximin  without benefit. S/P lap chole  #3.  IBS with alternating diarrhea and constipation. Neg colon with random Bx 05/2012, 06/2021. SIBO Aprl 2, 2021, Rxed with rifaxamin  #4.  Fatty liver with abn alk phos. Neg MRCP 2018. S/p cholecystectomy.  #5.  H/O polyps. Next colon due 06/2028  #6.  Comorbid conditions include obesity, anxiety/depression, chronic pain syndrome on narcotics, HTN, HLD, DM2, hypothyroidism, asthma, gout, chronic fatigue syndrome, fibromyalgia.  #7. H/O IDA/B12 def   Plan:  -CBC, CMP, lipase, B12, iron studies. -EGD with dil  -Continue nexium  40mg  po every day (has enough) -Bentyl  20mg  po qid prn #120, 2 refills. -Minimize pain medications. -Encouraged weight loss. -If still with problems and above WU is neg, would proceed with CT scan abdo/pelvis followed by GES off narcotics to r/o diabetic gastroparesis.   Proceed with EGD. I have discussed the risks and benefits. The risks including rare risk of perforation, bleeding, missed UGI neoplasms, risks of anesthesia/sedation. Alternatives were given. Patient is aware and agrees to proceed. All the questions were answered. This will be scheduled in upcoming days. Consent forms were given for review.  HPI:    Discussed the use of AI scribe software for clinical note transcription with the patient, who gave verbal consent to proceed.  History of Present Illness Christy Long is a 68 year old female who presents with difficulty swallowing.  She has experienced difficulty  swallowing since her back surgery in April 2025. The sensation is described as food getting stuck, affecting both solids and liquids, particularly with rice and meats. Despite chewing finely, she states that she just don't go through.  She experiences occasional heartburn, which she manages with Nexium  40 mg, as it works better for her than Protonix . No correlation between asthma attacks and heartburn has been noted.  She reports constipation, describing passing large stools and feeling bloated, especially under her breast, with pain on both sides, more pronounced on the right. She experiences nausea frequently and vomits about once a week.  Her medication regimen includes allopurinol  for gout, clonidine  for blood pressure, and trazodone  at night. She has stopped taking Xanax  and Trulicity, and she no longer uses Ozempic due to gastrointestinal side effects. She takes insulin  and other medications for blood pressure and thyroid  management.     From prev records -C/O chronic RUQ abdo pain x several years. Neg EGD Jan 2021 with neg eso and SB Bx/dil, Nl solid-phase GES 01/2020.  She also underwent empiric eso dil at the time of endoscopy with resolution of dysphagia. SIBO breath test 02/2020 was suspicious for bacterial overgrowth.  She underwent a trial of rifaximin  550 mg TID for 2 weeks without any significant relief. -No jaundice dark urine or pale stools.  Note that she is s/p cholecystectomy. No H/O itching, skin lesions, easy bruisability, intake of OTC meds including diet pills, herbal medications, anabolic steroids or Tylenol . There is no H/O blood transfusions, IVDA or FH of liver disease. No jaundice, dark urine or pale stools.  No alcohol abuse. -stomach problems all my life. Dx with IBS with alternating diarrhea and constipation several years ago. -The GI symptoms get worse with stress and anxiety.   Wt Readings from Last 3 Encounters:  11/03/24 229 lb (103.9 kg)  02/26/22 218 lb (98.9  kg)  10/26/21 215 lb 6 oz (97.7 kg)    Previous GI work-up:  MRCP Dec 2022 1. Hepatomegaly with hepatic steatosis. 2. Status post cholecystectomy. There is chronic dilatation of the common bile duct without evidence of significant intrahepatic biliary ductal dilatation, presumably reflective of benign post cholecystectomy physiology. No choledocholithiasis or other evidence to suggest biliary tract obstruction.   CT Abdo/pelvis with p.o. and IV contrast May 16, 2021 -Hepatomegaly with mild diffuse hepatic steatosis. -Wall thickening hepatic flexure. -Colonic diverticulosis without diverticulitis -Aortic atherosclerosis  Colonoscopy 06/2021 - Two 4 to 6 mm polyps in the distal sigmoid colon and in the mid descending colon, removed with a cold snare. Resected and retrieved. Bx- TA x 1. Rpt in 7 yrs. - Minimal sigmoid diverticulosis. - Non-bleeding internal hemorrhoids.   @[facility] : MRCP 09/2017: Hepatomegaly, s/p cholecystectomy.  Mildly prominent CBD 8 mm.  No choledocholithiasis.  Normal PD.  EGD 12/2019 -Moderate gastritis. -Otherwise normal EGD. -S/P empiric esophageal dilatation.  EGD with small bowel biopsies 06/04/2012: neg except for erosive esophagitis LA gd A, neg SB bx, neg CLO. Colonoscopy 06/04/2012: Normal except for small hyperplastic polyps, neg random colonic bx. Hydrogen breath test for SIBO-results awaited. Meckel scan 09/2017: neg US  03/2017: CT 2016: neg per notes, report awaited.  Addendum: Got few records 10/13/2019 Hb 11.7, MCV 86, plt 272, WBC count 10 K, B12 169 (being replaced). Nl LFTs except for alk phos 170. Nl BUN/creatinine 15/0.78.SABRA TSH 4.3.  Urine tox screen positive for opioids.  Hemoglobin A1c 7.1 Past Medical History:  Diagnosis Date   Allergic rhinitis    Anxiety disorder    Arthritis    Chronic fatigue    Chronic pain disorder    Cramp and spasm    Essential (primary) hypertension    Fibromyalgia    Gout    Heart rate fast     per patient happens during anxiety    Hyperlipemia, mixed    Hypothyroidism    IBS (irritable bowel syndrome)    Indigestion    Insomnia    Low back pain    Nontoxic single thyroid  nodule    Other vitamin B12 deficiency anemias    Pneumonia    PONV (postoperative nausea and vomiting)    per patient gets a N/V and headache upon waking up from anesthesia   Type 2 diabetes mellitus ([facility])    Unspecified menopausal and perimenopausal disorder    Vitamin D deficiency     Past Surgical History:  Procedure Laterality Date   ANKLE SURGERY     x5   BACK SURGERY     2nd back surgery , 1st - 9 /2019, 2nd - 05/25/20   BLADDER SURGERY  1999   bladder tack   CESAREAN SECTION     x3   CHOLECYSTECTOMY     COLONOSCOPY WITH ESOPHAGOGASTRODUODENOSCOPY (EGD)  05/2012   LUMBAR FUSION  09/2018   SHOULDER SURGERY  2011   TOTAL ABDOMINAL HYSTERECTOMY      Family History  Problem Relation Age of Onset   Diabetes Mother    Colon cancer Neg Hx    Pancreatic cancer Neg Hx    Stomach cancer Neg Hx    Esophageal cancer Neg Hx  Liver disease Neg Hx     Social History   Tobacco Use   Smoking status: Former    Pack years: 0.00    Types: Cigarettes    Quit date: 1994    Years since quitting: 28.5   Smokeless tobacco: Never  Vaping Use   Vaping Use: Never used  Substance Use Topics   Alcohol use: Never   Drug use: Never    Current Outpatient Medications  Medication Sig Dispense Refill   albuterol  (VENTOLIN  HFA) 108 (90 Base) MCG/ACT inhaler Inhale 1-2 puffs into the lungs every 6 (six) hours as needed for wheezing or shortness of breath.      allopurinol  (ZYLOPRIM ) 300 MG tablet Take 300 mg by mouth daily.     ALPRAZolam  (XANAX ) 0.5 MG tablet Take 0.5 mg by mouth as needed for anxiety.     busPIRone  (BUSPAR ) 10 MG tablet Take 10 mg by mouth 2 (two) times daily.     cloNIDine  (CATAPRES ) 0.1 MG tablet Take 0.1 mg by mouth 3 (three) times daily.      dicyclomine  (BENTYL ) 10 MG capsule  Take 1 capsule (10 mg total) by mouth 3 (three) times daily before meals. (Patient taking differently: Take 10 mg by mouth 4 (four) times daily.) 120 capsule 0   esomeprazole  (NEXIUM ) 40 MG capsule Take 1 capsule (40 mg total) by mouth daily at 12 noon. 30 capsule 6   fluticasone  (CUTIVATE ) 0.05 % cream Apply per rectum twice a day for 10 days 30 g 2   glipiZIDE  (GLUCOTROL  XL) 2.5 MG 24 hr tablet Take 2.5 mg by mouth in the morning and at bedtime.     labetalol  (NORMODYNE ) 100 MG tablet Take 100 mg by mouth 2 (two) times daily.     levocetirizine (XYZAL ) 5 MG tablet Take 5 mg by mouth every evening.     levothyroxine  (SYNTHROID ) 100 MCG tablet Take 100 mcg by mouth daily before breakfast.     losartan  (COZAAR ) 50 MG tablet Take 50 mg by mouth in the morning and at bedtime.      metFORMIN  (GLUCOPHAGE -XR) 500 MG 24 hr tablet Take 500 mg by mouth in the morning and at bedtime.     oxyCODONE -acetaminophen  (PERCOCET) 10-325 MG tablet Take 1 tablet by mouth every 4 (four) hours as needed for pain. 60 tablet 0   traZODone  (DESYREL ) 100 MG tablet Take 100 mg by mouth at bedtime.     No current facility-administered medications for this visit.    Allergies  Allergen Reactions   Sulfa Antibiotics Shortness Of Breath   Sulfamethoxazole Anaphylaxis   Duloxetine Other (See Comments)    nervous    Gabapentin Swelling   Nsaids Other (See Comments)    Stomach burns    Oseltamivir Swelling    Tongue swelling    Prednisone    Pregabalin Swelling    Tongue swells     Review of Systems:  Has fatigue Has anxiety/depression     Physical Exam:       11/03/2024    8:52 AM 02/26/2022    9:15 AM 10/26/2021    2:00 PM  Vitals with BMI  Height 5' 5 5' 5 5' 5  Weight 229 lbs 218 lbs 215 lbs 6 oz  BMI 38.11 36.28 35.84  Systolic 130 160 863  Diastolic 80 90 84  Pulse 74 91 80   Gen: awake, alert, NAD HEENT: anicteric, no pallor CV: RRR, no mrg Pulm: CTA b/l Abd: soft, generalized  abdominal  tenderness/abdominal wall tenderness, +BS throughout Ext: no c/c/e Neuro: nonfocal      Anselm Bring, MD 06/19/2021, 9:46 AM  Cc: Zachary Lamar BRAVO, NP

## 2024-11-03 NOTE — Patient Instructions (Signed)
 Your provider has requested that you go to the basement level for lab work before leaving today. Press B on the elevator. The lab is located at the first door on the left as you exit the elevator.  We have sent the following medications to your pharmacy for you to pick up at your convenience:  Bentyl   Continue Nexium   You have been scheduled for an endoscopy. Please follow written instructions given to you at your visit today.  If you use inhalers (even only as needed), please bring them with you on the day of your procedure.  If you take any of the following medications, they will need to be adjusted prior to your procedure:   DO NOT TAKE 7 DAYS PRIOR TO TEST- Trulicity (dulaglutide) Ozempic, Wegovy (semaglutide) Mounjaro, Zepbound (tirzepatide) Bydureon Bcise (exanatide extended release)  DO NOT TAKE 1 DAY PRIOR TO YOUR TEST Rybelsus (semaglutide) Adlyxin (lixisenatide) Victoza (liraglutide) Byetta (exanatide) ___________________________________________________________________________

## 2024-11-06 ENCOUNTER — Ambulatory Visit: Payer: Self-pay | Admitting: Gastroenterology

## 2024-11-08 ENCOUNTER — Encounter: Payer: Self-pay | Admitting: Gastroenterology

## 2024-11-08 ENCOUNTER — Ambulatory Visit: Admitting: Gastroenterology

## 2024-11-08 VITALS — BP 184/87 | HR 88 | Temp 97.3°F | Resp 12 | Ht 65.0 in | Wt 229.0 lb

## 2024-11-08 DIAGNOSIS — K2289 Other specified disease of esophagus: Secondary | ICD-10-CM

## 2024-11-08 DIAGNOSIS — K219 Gastro-esophageal reflux disease without esophagitis: Secondary | ICD-10-CM

## 2024-11-08 DIAGNOSIS — R1310 Dysphagia, unspecified: Secondary | ICD-10-CM

## 2024-11-08 MED ORDER — SODIUM CHLORIDE 0.9 % IV SOLN: 500.0000 mL | Freq: Once | INTRAVENOUS | Status: DC

## 2024-11-08 NOTE — Op Note (Signed)
 [facility] Patient Name: Christy Long Procedure Date: 11/08/2024 7:52 AM MRN: [id number] Endoscopist: Lynnie Bring , MD, [id number] Age: 68 Referring MD:  Date of Birth: [birth year] Gender: Female Account #: [id number] Procedure:                Upper GI endoscopy Indications:              Dysphagia. GERD Medicines:                Monitored Anesthesia Care Procedure:                Pre-Anesthesia Assessment:                           - Prior to the procedure, a History and Physical                            was performed, and patient medications and                            allergies were reviewed. The patient's tolerance of                            previous anesthesia was also reviewed. The risks                            and benefits of the procedure and the sedation                            options and risks were discussed with the patient.                            All questions were answered, and informed consent                            was obtained. Prior Anticoagulants: The patient has                            taken no anticoagulant or antiplatelet agents. ASA                            Grade Assessment: III - A patient with severe                            systemic disease. After reviewing the risks and                            benefits, the patient was deemed in satisfactory                            condition to undergo the procedure.                           After obtaining informed consent, the endoscope was  passed under direct vision. Throughout the                            procedure, the patient's blood pressure, pulse, and                            oxygen saturations were monitored continuously. The                            Olympus Scope [id number] was introduced through the                            mouth, and advanced to the second part of duodenum.                            The upper GI endoscopy was  accomplished without                            difficulty. The patient tolerated the procedure                            well. Scope In: Scope Out: Findings:                 The distal esophagus was mildly tortuous. No                            strictures. The scope was withdrawn. Dilation was                            performed with a Maloney dilator with mild                            resistance at 52 Fr and 54 Fr. Eso Bx not obtained                            since previous biopsies were negative for EoE.                           The Z-line was regular and was found 36 cm from the                            incisors.                           The entire examined stomach was normal.                           The examined duodenum was normal. Complications:            No immediate complications. Estimated Blood Loss:     Estimated blood loss: none. Impression:               - Mild presbyesphagus. Dilated.                           -  Otherwise Nl EGD.                           - No specimens collected. Recommendation:           - Patient has a contact number available for                            emergencies. The signs and symptoms of potential                            delayed complications were discussed with the                            patient. Return to normal activities tomorrow.                            Written discharge instructions were provided to the                            patient.                           - Post dil diet.                           - Continue present medications.                           - The findings and recommendations were discussed                            with the patient's family. Lynnie Bring, MD 11/08/2024 8:28:47 AM This report has been signed electronically.

## 2024-11-08 NOTE — Progress Notes (Signed)
 Report to PACU, RN, vss, BBS= Clear.

## 2024-11-08 NOTE — Patient Instructions (Signed)
 Follow post dilation diet. Continue present medications.   YOU HAD AN ENDOSCOPIC PROCEDURE TODAY AT THE [facility]:   Refer to the procedure report that was given to you for any specific questions about what was found during the examination.  If the procedure report does not answer your questions, please call your gastroenterologist to clarify.  If you requested that your care partner not be given the details of your procedure findings, then the procedure report has been included in a sealed envelope for you to review at your convenience later.  YOU SHOULD EXPECT: Some feelings of bloating in the abdomen. Passage of more gas than usual.  Walking can help get rid of the air that was put into your GI tract during the procedure and reduce the bloating. If you had a lower endoscopy (such as a colonoscopy or flexible sigmoidoscopy) you may notice spotting of blood in your stool or on the toilet paper. If you underwent a bowel prep for your procedure, you may not have a normal bowel movement for a few days.  Please Note:  You might notice some irritation and congestion in your nose or some drainage.  This is from the oxygen used during your procedure.  There is no need for concern and it should clear up in a day or so.  SYMPTOMS TO REPORT IMMEDIATELY:  Following upper endoscopy (EGD)  Vomiting of blood or coffee ground material  New chest pain or pain under the shoulder blades  Painful or persistently difficult swallowing  New shortness of breath  Fever of 100F or higher  Black, tarry-looking stools  For urgent or emergent issues, a gastroenterologist can be reached at any hour by calling [phone]. Do not use MyChart messaging for urgent concerns.    DIET:  We do recommend a small meal at first, but then you may proceed to your regular diet.  Drink plenty of fluids but you should avoid alcoholic beverages for 24 hours.  ACTIVITY:  You should plan to take it easy for the rest of  today and you should NOT DRIVE or use heavy machinery until tomorrow (because of the sedation medicines used during the test).    FOLLOW UP: Our staff will call the number listed on your records the next business day following your procedure.  We will call around 7:15- 8:00 am to check on you and address any questions or concerns that you may have regarding the information given to you following your procedure. If we do not reach you, we will leave a message.     If any biopsies were taken you will be contacted by phone or by letter within the next 1-3 weeks.  Please call us  at [phone] if you have not heard about the biopsies in 3 weeks.    SIGNATURES/CONFIDENTIALITY: You and/or your care partner have signed paperwork which will be entered into your electronic medical record.  These signatures attest to the fact that that the information above on your After Visit Summary has been reviewed and is understood.  Full responsibility of the confidentiality of this discharge information lies with you and/or your care-partner.

## 2024-11-08 NOTE — Progress Notes (Signed)
 Chief Complaint: FU  Referring Provider:  Zachary Lamar BRAVO, NP      ASSESSMENT AND PLAN;   #1. GERD with eso dysphagia  #2. Chronic RUQ with neg WU as below. Likely combination of musculoskeletal pain related to fibromyalgia and fatty liver. S/P lap chole.  Neg extensive WU including EGD with SB bx and GES 12/2019, colon 06/2021, MRCP 2018, Meckel's scan 2018, US , Cts as below.  H2 breath test suspicious for SIBO 02/2020, Rxed with rifaximin  without benefit. S/P lap chole  #3.  IBS with alternating diarrhea and constipation. Neg colon with random Bx 05/2012, 06/2021. SIBO Aprl 2, 2021, Rxed with rifaxamin  #4.  Fatty liver with abn alk phos. Neg MRCP 2018. S/p cholecystectomy.  #5.  H/O polyps. Next colon due 06/2028  #6.  Comorbid conditions include obesity, anxiety/depression, chronic pain syndrome on narcotics, HTN, HLD, DM2, hypothyroidism, asthma, gout, chronic fatigue syndrome, fibromyalgia.  #7. H/O IDA/B12 def   Plan:  -CBC, CMP, lipase, B12, iron studies. -EGD with dil  -Continue nexium  40mg  po every day (has enough) -Bentyl  20mg  po qid prn #120, 2 refills. -Minimize pain medications. -Encouraged weight loss. -If still with problems and above WU is neg, would proceed with CT scan abdo/pelvis followed by GES off narcotics to r/o diabetic gastroparesis.   Proceed with EGD. I have discussed the risks and benefits. The risks including rare risk of perforation, bleeding, missed UGI neoplasms, risks of anesthesia/sedation. Alternatives were given. Patient is aware and agrees to proceed. All the questions were answered. This will be scheduled in upcoming days. Consent forms were given for review.  HPI:    Discussed the use of AI scribe software for clinical note transcription with the patient, who gave verbal consent to proceed.  History of Present Illness Christy Long is a 68 year old female who presents with difficulty swallowing.  She has experienced difficulty  swallowing since her back surgery in April 2025. The sensation is described as food getting stuck, affecting both solids and liquids, particularly with rice and meats. Despite chewing finely, she states that she just don't go through.  She experiences occasional heartburn, which she manages with Nexium  40 mg, as it works better for her than Protonix . No correlation between asthma attacks and heartburn has been noted.  She reports constipation, describing passing large stools and feeling bloated, especially under her breast, with pain on both sides, more pronounced on the right. She experiences nausea frequently and vomits about once a week.  Her medication regimen includes allopurinol  for gout, clonidine  for blood pressure, and trazodone  at night. She has stopped taking Xanax  and Trulicity, and she no longer uses Ozempic due to gastrointestinal side effects. She takes insulin  and other medications for blood pressure and thyroid  management.     From prev records -C/O chronic RUQ abdo pain x several years. Neg EGD Jan 2021 with neg eso and SB Bx/dil, Nl solid-phase GES 01/2020.  She also underwent empiric eso dil at the time of endoscopy with resolution of dysphagia. SIBO breath test 02/2020 was suspicious for bacterial overgrowth.  She underwent a trial of rifaximin  550 mg TID for 2 weeks without any significant relief. -No jaundice dark urine or pale stools.  Note that she is s/p cholecystectomy. No H/O itching, skin lesions, easy bruisability, intake of OTC meds including diet pills, herbal medications, anabolic steroids or Tylenol . There is no H/O blood transfusions, IVDA or FH of liver disease. No jaundice, dark urine or pale stools.  No alcohol abuse. -stomach problems all my life. Dx with IBS with alternating diarrhea and constipation several years ago. -The GI symptoms get worse with stress and anxiety.   Wt Readings from Last 3 Encounters:  11/08/24 229 lb (103.9 kg)  11/03/24 229 lb (103.9  kg)  02/26/22 218 lb (98.9 kg)    Previous GI work-up:  MRCP Dec 2022 1. Hepatomegaly with hepatic steatosis. 2. Status post cholecystectomy. There is chronic dilatation of the common bile duct without evidence of significant intrahepatic biliary ductal dilatation, presumably reflective of benign post cholecystectomy physiology. No choledocholithiasis or other evidence to suggest biliary tract obstruction.   CT Abdo/pelvis with p.o. and IV contrast May 16, 2021 -Hepatomegaly with mild diffuse hepatic steatosis. -Wall thickening hepatic flexure. -Colonic diverticulosis without diverticulitis -Aortic atherosclerosis  Colonoscopy 06/2021 - Two 4 to 6 mm polyps in the distal sigmoid colon and in the mid descending colon, removed with a cold snare. Resected and retrieved. Bx- TA x 1. Rpt in 7 yrs. - Minimal sigmoid diverticulosis. - Non-bleeding internal hemorrhoids.   @[facility] : MRCP 09/2017: Hepatomegaly, s/p cholecystectomy.  Mildly prominent CBD 8 mm.  No choledocholithiasis.  Normal PD.  EGD 12/2019 -Moderate gastritis. -Otherwise normal EGD. -S/P empiric esophageal dilatation.  EGD with small bowel biopsies 06/04/2012: neg except for erosive esophagitis LA gd A, neg SB bx, neg CLO. Colonoscopy 06/04/2012: Normal except for small hyperplastic polyps, neg random colonic bx. Hydrogen breath test for SIBO-results awaited. Meckel scan 09/2017: neg US  03/2017: CT 2016: neg per notes, report awaited.  Addendum: Got few records 10/13/2019 Hb 11.7, MCV 86, plt 272, WBC count 10 K, B12 169 (being replaced). Nl LFTs except for alk phos 170. Nl BUN/creatinine 15/0.78.SABRA TSH 4.3.  Urine tox screen positive for opioids.  Hemoglobin A1c 7.1 Past Medical History:  Diagnosis Date   Allergic rhinitis    Anxiety disorder    Arthritis    Chronic fatigue    Chronic pain disorder    Cramp and spasm    Essential (primary) hypertension    Fibromyalgia    Gout    Heart rate fast    per  patient happens during anxiety    Hyperlipemia, mixed    Hypothyroidism    IBS (irritable bowel syndrome)    Indigestion    Insomnia    Low back pain    Nontoxic single thyroid  nodule    Other vitamin B12 deficiency anemias    Pneumonia    PONV (postoperative nausea and vomiting)    per patient gets a N/V and headache upon waking up from anesthesia   Type 2 diabetes mellitus (HCC)    Unspecified menopausal and perimenopausal disorder    Vitamin D deficiency     Past Surgical History:  Procedure Laterality Date   ANKLE SURGERY     x5   BACK SURGERY     2nd back surgery , 1st - 9 /2019, 2nd - 05/25/20   BLADDER SURGERY  1999   bladder tack   CESAREAN SECTION     x3   CHOLECYSTECTOMY     COLONOSCOPY WITH ESOPHAGOGASTRODUODENOSCOPY (EGD)  05/2012   LUMBAR FUSION  09/2018   SHOULDER SURGERY  2011   TOTAL ABDOMINAL HYSTERECTOMY      Family History  Problem Relation Age of Onset   Diabetes Mother    Colon cancer Neg Hx    Pancreatic cancer Neg Hx    Stomach cancer Neg Hx    Esophageal cancer Neg Hx  Liver disease Neg Hx     Social History   Tobacco Use   Smoking status: Former    Pack years: 0.00    Types: Cigarettes    Quit date: 1994    Years since quitting: 28.5   Smokeless tobacco: Never  Vaping Use   Vaping Use: Never used  Substance Use Topics   Alcohol use: Never   Drug use: Never    Current Outpatient Medications  Medication Sig Dispense Refill   albuterol  (VENTOLIN  HFA) 108 (90 Base) MCG/ACT inhaler Inhale 1-2 puffs into the lungs every 6 (six) hours as needed for wheezing or shortness of breath.      allopurinol  (ZYLOPRIM ) 300 MG tablet Take 300 mg by mouth daily.     ALPRAZolam  (XANAX ) 0.5 MG tablet Take 0.5 mg by mouth as needed for anxiety.     busPIRone  (BUSPAR ) 10 MG tablet Take 10 mg by mouth 2 (two) times daily.     cloNIDine  (CATAPRES ) 0.1 MG tablet Take 0.1 mg by mouth 3 (three) times daily.      dicyclomine  (BENTYL ) 10 MG capsule Take 1  capsule (10 mg total) by mouth 3 (three) times daily before meals. (Patient taking differently: Take 10 mg by mouth 4 (four) times daily.) 120 capsule 0   esomeprazole  (NEXIUM ) 40 MG capsule Take 1 capsule (40 mg total) by mouth daily at 12 noon. 30 capsule 6   fluticasone  (CUTIVATE ) 0.05 % cream Apply per rectum twice a day for 10 days 30 g 2   glipiZIDE  (GLUCOTROL  XL) 2.5 MG 24 hr tablet Take 2.5 mg by mouth in the morning and at bedtime.     labetalol  (NORMODYNE ) 100 MG tablet Take 100 mg by mouth 2 (two) times daily.     levocetirizine (XYZAL ) 5 MG tablet Take 5 mg by mouth every evening.     levothyroxine  (SYNTHROID ) 100 MCG tablet Take 100 mcg by mouth daily before breakfast.     losartan  (COZAAR ) 50 MG tablet Take 50 mg by mouth in the morning and at bedtime.      metFORMIN  (GLUCOPHAGE -XR) 500 MG 24 hr tablet Take 500 mg by mouth in the morning and at bedtime.     oxyCODONE -acetaminophen  (PERCOCET) 10-325 MG tablet Take 1 tablet by mouth every 4 (four) hours as needed for pain. 60 tablet 0   traZODone  (DESYREL ) 100 MG tablet Take 100 mg by mouth at bedtime.     No current facility-administered medications for this visit.    Allergies  Allergen Reactions   Sulfa Antibiotics Shortness Of Breath   Sulfamethoxazole Anaphylaxis   Duloxetine Other (See Comments)    nervous    Gabapentin Swelling   Nsaids Other (See Comments)    Stomach burns    Oseltamivir Swelling    Tongue swelling    Prednisone    Pregabalin Swelling    Tongue swells     Review of Systems:  Has fatigue Has anxiety/depression     Physical Exam:       11/08/2024    7:23 AM 11/03/2024    8:52 AM 02/26/2022    9:15 AM  Vitals with BMI  Height 5' 5 5' 5 5' 5  Weight 229 lbs 229 lbs 218 lbs  BMI 38.11 38.11 36.28  Systolic 157 130 839  Diastolic 62 80 90  Pulse 86 74 91   Gen: awake, alert, NAD HEENT: anicteric, no pallor CV: RRR, no mrg Pulm: CTA b/l Abd: soft, generalized abdominal  tenderness/abdominal wall  tenderness, +BS throughout Ext: no c/c/e Neuro: nonfocal      Anselm Bring, MD 06/19/2021, 9:46 AM  Cc: Zachary Lamar BRAVO, NP

## 2024-11-08 NOTE — Progress Notes (Signed)
 Called to room to assist during endoscopic procedure.  Patient ID and intended procedure confirmed with present staff. Received instructions for my participation in the procedure from the performing physician.

## 2024-11-08 NOTE — Progress Notes (Signed)
 Pt's states no medical or surgical changes since previsit or office visit.

## 2024-11-09 ENCOUNTER — Telehealth: Payer: Self-pay

## 2024-11-09 NOTE — Telephone Encounter (Signed)
  Follow up Call-     11/08/2024    7:29 AM  Call back number  Post procedure Call Back phone  # [phone]  Permission to leave phone message Yes     Patient questions:  Do you have a fever, pain , or abdominal swelling? No. Pain Score  0 *  Have you tolerated food without any problems? Yes.    Have you been able to return to your normal activities? Yes.    Do you have any questions about your discharge instructions: Diet   No. Medications  No. Follow up visit  No.  Do you have questions or concerns about your Care? No.  Actions: * If pain score is 4 or above: No action needed, pain <4.
